# Patient Record
Sex: Female | Born: 1960
Health system: Southern US, Community
[De-identification: ages and names within clinical notes are randomized; demographics above are authoritative.]

## PROBLEM LIST (undated history)

## (undated) DIAGNOSIS — S8290XB Unspecified fracture of unspecified lower leg, initial encounter for open fracture type I or II: Secondary | ICD-10-CM

## (undated) DIAGNOSIS — IMO0002 Reserved for concepts with insufficient information to code with codable children: Secondary | ICD-10-CM

## (undated) HISTORY — DX: Reserved for concepts with insufficient information to code with codable children: IMO0002

## (undated) HISTORY — DX: Unspecified fracture of unspecified lower leg, initial encounter for open fracture type I or II: S82.90XB

## (undated) HISTORY — PX: TONSILLECTOMY AND ADENOIDECTOMY: SUR1326

---

## 1991-06-03 DIAGNOSIS — IMO0002 Reserved for concepts with insufficient information to code with codable children: Secondary | ICD-10-CM

## 1991-06-03 DIAGNOSIS — R87619 Unspecified abnormal cytological findings in specimens from cervix uteri: Secondary | ICD-10-CM

## 1991-06-03 HISTORY — PX: TUBAL LIGATION: SHX77

## 1991-06-03 HISTORY — DX: Unspecified abnormal cytological findings in specimens from cervix uteri: R87.619

## 1991-06-03 HISTORY — DX: Reserved for concepts with insufficient information to code with codable children: IMO0002

## 2000-06-23 ENCOUNTER — Encounter: Admission: RE | Admit: 2000-06-23 | Discharge: 2000-06-23 | Payer: Self-pay | Admitting: Family Medicine

## 2000-06-23 ENCOUNTER — Encounter: Payer: Self-pay | Admitting: Family Medicine

## 2001-10-22 ENCOUNTER — Other Ambulatory Visit: Admission: RE | Admit: 2001-10-22 | Discharge: 2001-10-22 | Payer: Self-pay | Admitting: Obstetrics and Gynecology

## 2001-11-02 ENCOUNTER — Encounter: Admission: RE | Admit: 2001-11-02 | Discharge: 2001-11-02 | Payer: Self-pay | Admitting: *Deleted

## 2001-11-02 ENCOUNTER — Encounter: Payer: Self-pay | Admitting: *Deleted

## 2004-12-24 ENCOUNTER — Other Ambulatory Visit: Admission: RE | Admit: 2004-12-24 | Discharge: 2004-12-24 | Payer: Self-pay | Admitting: Obstetrics and Gynecology

## 2013-03-09 ENCOUNTER — Encounter: Payer: Self-pay | Admitting: Nurse Practitioner

## 2013-03-09 ENCOUNTER — Ambulatory Visit (INDEPENDENT_AMBULATORY_CARE_PROVIDER_SITE_OTHER): Payer: 59 | Admitting: Nurse Practitioner

## 2013-03-09 VITALS — BP 120/74 | HR 64 | Resp 24 | Ht 64.5 in | Wt 125.0 lb

## 2013-03-09 DIAGNOSIS — Z1211 Encounter for screening for malignant neoplasm of colon: Secondary | ICD-10-CM

## 2013-03-09 DIAGNOSIS — J209 Acute bronchitis, unspecified: Secondary | ICD-10-CM

## 2013-03-09 DIAGNOSIS — Z0000 Encounter for general adult medical examination without abnormal findings: Secondary | ICD-10-CM

## 2013-03-09 DIAGNOSIS — Z01419 Encounter for gynecological examination (general) (routine) without abnormal findings: Secondary | ICD-10-CM

## 2013-03-09 DIAGNOSIS — Z23 Encounter for immunization: Secondary | ICD-10-CM

## 2013-03-09 DIAGNOSIS — N912 Amenorrhea, unspecified: Secondary | ICD-10-CM

## 2013-03-09 LAB — TSH: TSH: 2.766 u[IU]/mL (ref 0.350–4.500)

## 2013-03-09 LAB — POCT URINALYSIS DIPSTICK
Bilirubin, UA: NEGATIVE
Blood, UA: NEGATIVE
Glucose, UA: NEGATIVE
Ketones, UA: NEGATIVE
Leukocytes, UA: NEGATIVE
Nitrite, UA: NEGATIVE
Protein, UA: NEGATIVE
Urobilinogen, UA: NEGATIVE
pH, UA: 6

## 2013-03-09 LAB — HEMOGLOBIN, FINGERSTICK: Hemoglobin, fingerstick: 14.4 g/dL (ref 12.0–16.0)

## 2013-03-09 LAB — LIPID PANEL
Cholesterol: 194 mg/dL (ref 0–200)
HDL: 61 mg/dL (ref >39)
LDL Cholesterol: 115 mg/dL — ABNORMAL HIGH (ref 0–99)
Total CHOL/HDL Ratio: 3.2 Ratio
Triglycerides: 89 mg/dL (ref <150)
VLDL: 18 mg/dL (ref 0–40)

## 2013-03-09 LAB — COMPREHENSIVE METABOLIC PANEL
ALT: 10 U/L (ref 0–35)
AST: 15 U/L (ref 0–37)
Albumin: 4.2 g/dL (ref 3.5–5.2)
Alkaline Phosphatase: 68 U/L (ref 39–117)
BUN: 4 mg/dL — ABNORMAL LOW (ref 6–23)
CO2: 25 mEq/L (ref 19–32)
Calcium: 9.5 mg/dL (ref 8.4–10.5)
Chloride: 104 mEq/L (ref 96–112)
Creat: 0.76 mg/dL (ref 0.50–1.10)
Glucose, Bld: 101 mg/dL — ABNORMAL HIGH (ref 70–99)
Potassium: 4.2 mEq/L (ref 3.5–5.3)
Sodium: 140 mEq/L (ref 135–145)
Total Bilirubin: 0.5 mg/dL (ref 0.3–1.2)
Total Protein: 6.7 g/dL (ref 6.0–8.3)

## 2013-03-09 MED ORDER — AZITHROMYCIN 250 MG PO TABS: 250.0000 mg | ORAL_TABLET | ORAL | Status: DC

## 2013-03-09 NOTE — Progress Notes (Signed)
Patient ID: Jo Bell, female   DOB: 1960/12/18, 52 y.o.   MRN: [id number] 52 y.o. G5P3. Married Caucasian Fe here for annual exam.  she has been a former patient of our and she ran a Orthoptist.  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  Now comes in after starting a new job to re establish care.  States during the illness and death of her father in law, she would  skip then stopped having cycles.  Has rare vaso symptoms but has noted vaginal dryness.  She had UTI a few months ago that is resolved.   Now for 1-2 weeks has URI symptoms of nasal congestion and cough that is worse at night.  Denies fever and chills.  No dyspnea and wheezing. She took some left over antibiotics of her husbands once daily for a week without help.    Patient's last menstrual period was 06/05/2011.          Sexually active: yes  The current method of family planning is post menopausal status.    Exercising: yes  Home exercise routine includes walking. Smoker:  yes  Health Maintenance: Pap:  Years ago MMG:  2012, normal Colonoscopy:  Never  BMD:   never TDaP:  >10 years Labs: HB: 14.4 Urine: Negative, pH 6.0    reports that she has been smoking Cigarettes.  She has been smoking about 0.50 packs per day. She has never used smokeless tobacco. She reports that she drinks about 1.5 ounces of alcohol per week. She reports that she does not use illicit drugs.  History reviewed. No pertinent past medical history.  Past Surgical History  Procedure Laterality Date  . Tubal ligation      No current outpatient prescriptions on file.   No current facility-administered medications for this visit.    Family History  Problem Relation Age of Onset  . Breast cancer Mother     ROS:  Pertinent items are noted in HPI.  Otherwise, a comprehensive ROS was negative.  Exam:   BP 120/74  Pulse 64  Resp 24  Ht 5' 4.5" (1.638 m)  Wt 125 lb (56.7 kg)  BMI 21.13 kg/m2  LMP 06/05/2011  Height: 5' 4.5" (163.8 cm)  Ht Readings from Last 3 Encounters:  03/09/13 5' 4.5" (1.638 m)    General appearance: alert, cooperative and appears stated age Head: Normocephalic, without obvious abnormality, atraumatic Neck: no adenopathy, supple, symmetrical, trachea midline and thyroid normal to inspection and palpation Lungs: clear to auscultation bilaterally with a congested cough. Breasts: normal appearance, no masses or tenderness dense with FCB changes. Heart: regular rate and rhythm Abdomen: soft, non-tender; no masses,  no organomegaly Extremities: extremities normal, atraumatic, no cyanosis or edema Skin: Skin color, texture, turgor normal. No rashes or lesions Lymph nodes: Cervical, supraclavicular, and axillary nodes normal. No abnormal inguinal nodes palpated Neurologic: Grossly normal   Pelvic: External genitalia:  no lesions              Urethra:  normal appearing urethra with no masses, tenderness or lesions              Bartholin's and Skene's: normal                 Vagina: normal appearing vagina with normal color and discharge, no lesions              Cervix: anteverted  Pap taken: yes Bimanual Exam:  Uterus:  normal size, contour, position, consistency, mobility, non-tender              Adnexa: no mass, fullness, tenderness               Rectovaginal: Confirms               Anus:  normal sphincter tone, no lesions  A:  Well Woman with normal exam  Menopausal with amenorrhea  Acute bronchitis  [facility] of breast cancer with mother at age 85  Immunization update  P:   Pap smear as per guidelines  pap is done  RX Zithromax Z pack # 6 , if not improved or worsens to see PCP.  Mammogram information to get scheduled  Will get colonoscopy scheduled  Update TDaP today  Follow with labs  Counseled on breast self exam, adequate intake of calcium and vitamin D, diet and exercise, Kegel's exercises return annually or prn  An After Visit Summary was printed  and given to the patient.

## 2013-03-09 NOTE — Patient Instructions (Addendum)

## 2013-03-10 LAB — VITAMIN D 25 HYDROXY (VIT D DEFICIENCY, FRACTURES): Vit D, 25-Hydroxy: 43 ng/mL (ref 30–89)

## 2013-03-10 LAB — FOLLICLE STIMULATING HORMONE: FSH: 81.6 m[IU]/mL

## 2013-03-11 LAB — IPS PAP TEST WITH HPV

## 2013-03-13 NOTE — Progress Notes (Signed)
Encounter reviewed by Dr. Brook Silva.  

## 2013-03-16 ENCOUNTER — Telehealth: Payer: Self-pay | Admitting: *Deleted

## 2013-03-16 NOTE — Telephone Encounter (Signed)
Message copied by Luisa Dago on Wed Mar 16, 2013  9:47 AM ------      Message from: Roanna Banning      Created: Mon Mar 14, 2013  8:13 AM       Let patient know her lab test results.  She was non fasting so OK that glucose is 101. The LDL was slightly elevated.  TSH and Vit D is normal. FSH shows menopausal range.  Would not expect any other vaginal bleed but if that occurs to call back. Pap is normal 02. ------

## 2013-03-16 NOTE — Telephone Encounter (Signed)
I have attempted to contact this patient by phone with the following results: left message to return my call on answering machine (mobile).  

## 2013-03-16 NOTE — Telephone Encounter (Signed)
Pt.notified

## 2013-06-22 ENCOUNTER — Other Ambulatory Visit: Payer: Self-pay | Admitting: Nurse Practitioner

## 2013-06-22 DIAGNOSIS — N63 Unspecified lump in unspecified breast: Secondary | ICD-10-CM

## 2013-07-12 ENCOUNTER — Other Ambulatory Visit: Payer: Self-pay | Admitting: Nurse Practitioner

## 2013-07-12 ENCOUNTER — Ambulatory Visit
Admission: RE | Admit: 2013-07-12 | Discharge: 2013-07-12 | Disposition: A | Discharge status: Home / Self Care | Payer: 59 | Source: Ambulatory Visit | Attending: Nurse Practitioner | Admitting: Nurse Practitioner

## 2013-07-12 DIAGNOSIS — N63 Unspecified lump in unspecified breast: Secondary | ICD-10-CM

## 2013-07-26 ENCOUNTER — Ambulatory Visit
Admission: RE | Admit: 2013-07-26 | Discharge: 2013-07-26 | Disposition: A | Discharge status: Home / Self Care | Payer: 59 | Source: Ambulatory Visit | Attending: Nurse Practitioner | Admitting: Nurse Practitioner

## 2013-07-26 DIAGNOSIS — N63 Unspecified lump in unspecified breast: Secondary | ICD-10-CM

## 2013-07-26 HISTORY — PX: BREAST BIOPSY: SHX20

## 2014-03-14 ENCOUNTER — Encounter: Payer: Self-pay | Admitting: Nurse Practitioner

## 2014-03-14 ENCOUNTER — Ambulatory Visit (INDEPENDENT_AMBULATORY_CARE_PROVIDER_SITE_OTHER): Payer: 59 | Admitting: Nurse Practitioner

## 2014-03-14 VITALS — BP 118/76 | HR 72 | Ht 64.0 in | Wt 131.0 lb

## 2014-03-14 DIAGNOSIS — Z0000 Encounter for general adult medical examination without abnormal findings: Secondary | ICD-10-CM

## 2014-03-14 DIAGNOSIS — N951 Menopausal and female climacteric states: Secondary | ICD-10-CM

## 2014-03-14 DIAGNOSIS — Z01419 Encounter for gynecological examination (general) (routine) without abnormal findings: Secondary | ICD-10-CM

## 2014-03-14 LAB — POCT URINALYSIS DIPSTICK
BILIRUBIN UA: NEGATIVE
Blood, UA: NEGATIVE
GLUCOSE UA: NEGATIVE
KETONES UA: NEGATIVE
LEUKOCYTES UA: NEGATIVE
Nitrite, UA: NEGATIVE
PROTEIN UA: NEGATIVE
Urobilinogen, UA: NEGATIVE
pH, UA: 6

## 2014-03-14 LAB — HEMOGLOBIN, FINGERSTICK: Hemoglobin, fingerstick: 15.6 g/dL (ref 12.0–16.0)

## 2014-03-14 NOTE — Patient Instructions (Signed)

## 2014-03-14 NOTE — Progress Notes (Signed)
Patient ID: Jo Bell, female   DOB: 07/11/1960, 53 y.o.   MRN: [id number] 53 y.o. [id number] Married Caucasian Fe here for annual exam.  Some vaso symptoms at night.  Will start a new job in 2 weeks.  New insurance benefits will be effective after 90 days.  Patient's last menstrual period was 06/05/2011.          Sexually active: yes  The current method of family planning is post menopausal status.  Exercising: yes Home exercise routine includes walking.  Smoker: yes, 1/2 ppd  Health Maintenance:  Pap: 03/09/13, WNL, neg HR HPV  MMG: 07/12/13, left ultrasound, 07/26/13 biopsy; fibrocystic changes, repeat in 1 year  Colonoscopy: Never  BMD: never  TDaP: 03/09/13 Labs:  HB:  15.6  Urine:  Negative     reports that she has been smoking Cigarettes.  She has a 20 pack-year smoking history. She has never used smokeless tobacco. She reports that she drinks about 1.5 - 2 ounces of alcohol per week. She reports that she does not use illicit drugs.  Past Medical History  Diagnosis Date  . Open fracture of leg age 47    bar went through right leg  . Abnormal Pap smear 1993    after childbirth with cryo, normal since    Past Surgical History  Procedure Laterality Date  . Tonsillectomy and adenoidectomy  age 53  . Tubal ligation  1993  . Breast biopsy Left 07/26/13    fibrocystic changes    No current outpatient prescriptions on file.   No current facility-administered medications for this visit.    Family History  Problem Relation Age of Onset  . Breast cancer Mother 1476    no treatment yet  . Diabetes Mother   . Breast cancer Maternal Grandmother     maybe age 45050's    ROS:  Pertinent items are noted in HPI.  Otherwise, a comprehensive ROS was negative.  Exam:   BP 118/76  Pulse 72  Ht 5\' 4"  (1.626 m)  Wt 131 lb (59.421 kg)  BMI 22.47 kg/m2  LMP 06/05/2011 Height: 5\' 4"  (162.6 cm)  Ht Readings from Last 3 Encounters:  03/14/14 5\' 4"  (1.626 m)  03/09/13 5' 4.5" (1.638 m)     General appearance: alert, cooperative and appears stated age Head: Normocephalic, without obvious abnormality, atraumatic Neck: no adenopathy, supple, symmetrical, trachea midline and thyroid normal to inspection and palpation Lungs: clear to auscultation bilaterally Breasts: normal appearance, no masses or tenderness Heart: regular rate and rhythm Abdomen: soft, non-tender; no masses,  no organomegaly Extremities: extremities normal, atraumatic, no cyanosis or edema Skin: Skin color, texture, turgor normal. No rashes or lesions Lymph nodes: Cervical, supraclavicular, and axillary nodes normal. No abnormal inguinal nodes palpated Neurologic: Grossly normal   Pelvic: External genitalia:  no lesions              Urethra:  normal appearing urethra with no masses, tenderness or lesions              Bartholin's and Skene's: normal                 Vagina: normal appearing vagina with normal color and discharge, no lesions              Cervix: anteverted              Pap taken: No. Bimanual Exam:  Uterus:  normal size, contour, position, consistency, mobility, non-tender  Adnexa: no mass, fullness, tenderness               Rectovaginal: Confirms               Anus:  normal sphincter tone, no lesions  A:  Well Woman with normal exam  Menopausal no HRT  [facility] 51   P:   Reviewed health and wellness pertinent to exam  Pap smear not taken today  Mammogram is due 2/16  She will schedule PCP appointment for colonoscopy after new insurance is effective  Counseled on breast self exam, mammography screening, adequate intake of calcium and vitamin D, diet and exercise return annually or prn  An After Visit Summary was printed and given to the patient.

## 2014-03-15 NOTE — Progress Notes (Signed)
Encounter reviewed by Dr. Brook Silva.  

## 2014-04-03 ENCOUNTER — Encounter: Payer: Self-pay | Admitting: Nurse Practitioner

## 2014-07-11 ENCOUNTER — Telehealth: Payer: Self-pay | Admitting: Nurse Practitioner

## 2014-07-11 NOTE — Telephone Encounter (Signed)
LMTCB about canceled appointment with PG °

## 2015-03-23 ENCOUNTER — Ambulatory Visit: Payer: 59 | Admitting: Nurse Practitioner

## 2016-08-20 ENCOUNTER — Ambulatory Visit: Payer: Self-pay | Admitting: Nurse Practitioner

## 2016-08-20 NOTE — Progress Notes (Deleted)
56 y.o. [id number] Married  Caucasian Fe here for annual exam.    Patient's last menstrual period was 06/05/2011.          Sexually active: {yes no:314532}  The current method of family planning is post menopausal status.    Exercising: {yes no:314532}  {types:19826} Smoker:  yes  Health Maintenance: Pap:  03/09/13, WNL, neg HR HPV  MMG:  07/12/13, left ultrasound, 07/26/13 biopsy; fibrocystic changes, repeat in 1 year  Colonoscopy:  *** TDaP:  03/09/13 Hep C and HIV: *** Labs: ***   reports that she has been smoking Cigarettes.  She has a 20.00 pack-year smoking history. She has never used smokeless tobacco. She reports that she drinks about 1.5 - 2.0 oz of alcohol per week . She reports that she does not use drugs.  Past Medical History:  Diagnosis Date  . Abnormal Pap smear 1993   after childbirth with cryo, normal since  . Open fracture of leg age 74   bar went through right leg    Past Surgical History:  Procedure Laterality Date  . BREAST BIOPSY Left 07/26/13   fibrocystic changes  . TONSILLECTOMY AND ADENOIDECTOMY  age [age]  . TUBAL LIGATION  1993    No current outpatient prescriptions on file.   No current facility-administered medications for this visit.     Family History  Problem Relation Age of Onset  . Breast cancer Mother 3876    no treatment yet  . Diabetes Mother   . Breast cancer Maternal Grandmother     maybe age 54's    ROS:  Pertinent items are noted in HPI.  Otherwise, a comprehensive ROS was negative.  Exam:   LMP 06/05/2011    Ht Readings from Last 3 Encounters:  03/14/14 5\' 4"  (1.626 m)  03/09/13 5' 4.5" (1.638 m)    General appearance: alert, cooperative and appears stated age Head: Normocephalic, without obvious abnormality, atraumatic Neck: no adenopathy, supple, symmetrical, trachea midline and thyroid {EXAM; THYROID:18604} Lungs: clear to auscultation bilaterally Breasts: {Exam; breast:13139::"normal appearance, no masses or  tenderness"} Heart: regular rate and rhythm Abdomen: soft, non-tender; no masses,  no organomegaly Extremities: extremities normal, atraumatic, no cyanosis or edema Skin: Skin color, texture, turgor normal. No rashes or lesions Lymph nodes: Cervical, supraclavicular, and axillary nodes normal. No abnormal inguinal nodes palpated Neurologic: Grossly normal   Pelvic: External genitalia:  no lesions              Urethra:  normal appearing urethra with no masses, tenderness or lesions              Bartholin's and Skene's: normal                 Vagina: normal appearing vagina with normal color and discharge, no lesions              Cervix: {exam; cervix:14595}              Pap taken: {yes no:314532} Bimanual Exam:  Uterus:  {exam; uterus:12215}              Adnexa: {exam; adnexa:12223}               Rectovaginal: Confirms               Anus:  normal sphincter tone, no lesions  Chaperone present: ***  A:  Well Woman with normal exam    Menopausal no HRT  [facility] of breast cancer with mother at age 82     P:   Reviewed health and wellness pertinent to exam  Pap smear as above  {plan; gyn:5269::"mammogram","pap smear","return annually or prn"}  An After Visit Summary was printed and given to the patient.

## 2016-09-17 ENCOUNTER — Ambulatory Visit: Payer: Self-pay | Admitting: Nurse Practitioner

## 2017-01-01 ENCOUNTER — Ambulatory Visit (INDEPENDENT_AMBULATORY_CARE_PROVIDER_SITE_OTHER): Payer: BLUE CROSS/BLUE SHIELD | Admitting: Certified Nurse Midwife

## 2017-01-01 ENCOUNTER — Other Ambulatory Visit (HOSPITAL_COMMUNITY)
Admission: RE | Admit: 2017-01-01 | Discharge: 2017-01-01 | Disposition: A | Discharge status: Home / Self Care | Payer: BLUE CROSS/BLUE SHIELD | Source: Ambulatory Visit | Attending: Certified Nurse Midwife | Admitting: Certified Nurse Midwife

## 2017-01-01 ENCOUNTER — Encounter: Payer: Self-pay | Admitting: Certified Nurse Midwife

## 2017-01-01 VITALS — BP 96/60 | HR 64 | Resp 16 | Ht 64.25 in | Wt 134.0 lb

## 2017-01-01 DIAGNOSIS — Z01419 Encounter for gynecological examination (general) (routine) without abnormal findings: Secondary | ICD-10-CM | POA: Diagnosis not present

## 2017-01-01 DIAGNOSIS — Z129 Encounter for screening for malignant neoplasm, site unspecified: Secondary | ICD-10-CM

## 2017-01-01 DIAGNOSIS — Z124 Encounter for screening for malignant neoplasm of cervix: Secondary | ICD-10-CM | POA: Diagnosis not present

## 2017-01-01 DIAGNOSIS — Z0000 Encounter for general adult medical examination without abnormal findings: Secondary | ICD-10-CM | POA: Diagnosis not present

## 2017-01-01 DIAGNOSIS — Z803 Family history of malignant neoplasm of breast: Secondary | ICD-10-CM

## 2017-01-01 DIAGNOSIS — R899 Unspecified abnormal finding in specimens from other organs, systems and tissues: Secondary | ICD-10-CM

## 2017-01-01 NOTE — Patient Instructions (Signed)
EXERCISE AND DIET:  We recommended that you start or continue a regular exercise program for good health. Regular exercise means any activity that makes your heart beat faster and makes you sweat.  We recommend exercising at least 30 minutes per day at least 3 days a week, preferably 4 or 5.  We also recommend a diet low in fat and sugar.  Inactivity, poor dietary choices and obesity can cause diabetes, heart attack, stroke, and kidney damage, among others.    ALCOHOL AND SMOKING:  Women should limit their alcohol intake to no more than 7 drinks/beers/glasses of wine (combined, not each!) per week. Moderation of alcohol intake to this level decreases your risk of breast cancer and liver damage. And of course, no recreational drugs are part of a healthy lifestyle.  And absolutely no smoking or even second hand smoke. Most people know smoking can cause heart and lung diseases, but did you know it also contributes to weakening of your bones? Aging of your skin?  Yellowing of your teeth and nails?  CALCIUM AND VITAMIN D:  Adequate intake of calcium and Vitamin D are recommended.  The recommendations for exact amounts of these supplements seem to change often, but generally speaking 600 mg of calcium (either carbonate or citrate) and 800 units of Vitamin D per day seems prudent. Certain women may benefit from higher intake of Vitamin D.  If you are among these women, your doctor will have told you during your visit.    PAP SMEARS:  Pap smears, to check for cervical cancer or precancers,  have traditionally been done yearly, although recent scientific advances have shown that most women can have pap smears less often.  However, every woman still should have a physical exam from her gynecologist every year. It will include a breast check, inspection of the vulva and vagina to check for abnormal growths or skin changes, a visual exam of the cervix, and then an exam to evaluate the size and shape of the uterus and  ovaries.  And after 54 years of age, a rectal exam is indicated to check for rectal cancers. We will also provide age appropriate advice regarding health maintenance, like when you should have certain vaccines, screening for sexually transmitted diseases, bone density testing, colonoscopy, mammograms, etc.   MAMMOGRAMS:  All women over 40 years old should have a yearly mammogram. Many facilities now offer a "3D" mammogram, which may cost around $50 extra out of pocket. If possible,  we recommend you accept the option to have the 3D mammogram performed.  It both reduces the number of women who will be called back for extra views which then turn out to be normal, and it is better than the routine mammogram at detecting truly abnormal areas.    COLONOSCOPY:  Colonoscopy to screen for colon cancer is recommended for all women at age 50.  We know, you hate the idea of the prep.  We agree, BUT, having colon cancer and not knowing it is worse!!  Colon cancer so often starts as a polyp that can be seen and removed at colonscopy, which can quite literally save your life!  And if your first colonoscopy is normal and you have no family history of colon cancer, most women don't have to have it again for 10 years.  Once every ten years, you can do something that may end up saving your life, right?  We will be happy to help you get it scheduled when you are ready.    Be sure to check your insurance coverage so you understand how much it will cost.  It may be covered as a preventative service at no cost, but you should check your particular policy.      Colonoscopy, Adult A colonoscopy is an exam to look at the entire large intestine. During the exam, a lubricated, bendable tube is inserted into the anus and then passed into the rectum, colon, and other parts of the large intestine. A colonoscopy is often done as a part of normal colorectal screening or in response to certain symptoms, such as anemia, persistent diarrhea,  abdominal pain, and blood in the stool. The exam can help screen for and diagnose medical problems, including:  Tumors.  Polyps.  Inflammation.  Areas of bleeding.  Tell a health care provider about:  Any allergies you have.  All medicines you are taking, including vitamins, herbs, eye drops, creams, and over-the-counter medicines.  Any problems you or family members have had with anesthetic medicines.  Any blood disorders you have.  Any surgeries you have had.  Any medical conditions you have.  Any problems you have had passing stool. What are the risks? Generally, this is a safe procedure. However, problems may occur, including:  Bleeding.  A tear in the intestine.  A reaction to medicines given during the exam.  Infection (rare).  What happens before the procedure? Eating and drinking restrictions Follow instructions from your health care provider about eating and drinking, which may include:  A few days before the procedure - follow a low-fiber diet. Avoid nuts, seeds, dried fruit, raw fruits, and vegetables.  1-3 days before the procedure - follow a clear liquid diet. Drink only clear liquids, such as clear broth or bouillon, black coffee or tea, clear juice, clear soft drinks or sports drinks, gelatin dessert, and popsicles. Avoid any liquids that contain red or purple dye.  On the day of the procedure - do not eat or drink anything during the 2 hours before the procedure, or within the time period that your health care provider recommends.  Bowel prep If you were prescribed an oral bowel prep to clean out your colon:  Take it as told by your health care provider. Starting the day before your procedure, you will need to drink a large amount of medicated liquid. The liquid will cause you to have multiple loose stools until your stool is almost clear or light green.  If your skin or anus gets irritated from diarrhea, you may use these to relieve the  irritation: ? Medicated wipes, such as adult wet wipes with aloe and vitamin E. ? A skin soothing-product like petroleum jelly.  If you vomit while drinking the bowel prep, take a break for up to 60 minutes and then begin the bowel prep again. If vomiting continues and you cannot take the bowel prep without vomiting, call your health care provider.  General instructions  Ask your health care provider about changing or stopping your regular medicines. This is especially important if you are taking diabetes medicines or blood thinners.  Plan to have someone take you home from the hospital or clinic. What happens during the procedure?  An IV tube may be inserted into one of your veins.  You will be given medicine to help you relax (sedative).  To reduce your risk of infection: ? Your health care team will wash or sanitize their hands. ? Your anal area will be washed with soap.  You will be asked to  lie on your side with your knees bent.  Your health care provider will lubricate a long, thin, flexible tube. The tube will have a camera and a light on the end.  The tube will be inserted into your anus.  The tube will be gently eased through your rectum and colon.  Air will be delivered into your colon to keep it open. You may feel some pressure or cramping.  The camera will be used to take images during the procedure.  A small tissue sample may be removed from your body to be examined under a microscope (biopsy). If any potential problems are found, the tissue will be sent to a lab for testing.  If small polyps are found, your health care provider may remove them and have them checked for cancer cells.  The tube that was inserted into your anus will be slowly removed. The procedure may vary among health care providers and hospitals. What happens after the procedure?  Your blood pressure, heart rate, breathing rate, and blood oxygen level will be monitored until the medicines you  were given have worn off.  Do not drive for 24 hours after the exam.  You may have a small amount of blood in your stool.  You may pass gas and have mild abdominal cramping or bloating due to the air that was used to inflate your colon during the exam.  It is up to you to get the results of your procedure. Ask your health care provider, or the department performing the procedure, when your results will be ready. This information is not intended to replace advice given to you by your health care provider. Make sure you discuss any questions you have with your health care provider. Document Released: 05/16/2000 Document Revised: 03/19/2016 Document Reviewed: 07/31/2015 [employer] Interactive Patient Education  2018 [employer]

## 2017-01-01 NOTE — Progress Notes (Signed)
56 y.o. [id number] Married  Caucasian Fe here for annual exam. Menopausal no HRT. Denies vaginal bleeding or vaginal dryness. Patient very busy with 2 jobs, and now one may have lay off. Very fatigued and would like screening labs. Patient is working on diet with spouse who has health issues. Has not had mammogram or colonoscopy due to job changes. Would like to schedule now. No other health issues today. Screening labs if needed.  Patient's last menstrual period was 06/05/2011.          Sexually active: No.  The current method of family planning is post menopausal status.    Exercising: Yes.    walking, swim Smoker:  yes  Health Maintenance: Pap:  03-09-13 neg HPV HR neg History of Abnormal Pap: yes, cryo in the past MMG:  2015 biopsy (fibrocystic changes with calcifications in the left breast) Self Breast exams: yes Colonoscopy:  none BMD:   none TDaP:  2014 Shingles: no Pneumonia: no Hep C and HIV: HIV neg yrs ago Labs: yes   reports that she has been smoking Cigarettes.  She has a 10.00 pack-year smoking history. She has never used smokeless tobacco. She reports that she drinks about 0.6 - 3.0 oz of alcohol per week . She reports that she does not use drugs.  Past Medical History:  Diagnosis Date  . Abnormal Pap smear 1993   after childbirth with cryo, normal since  . Open fracture of leg age 21   bar went through right leg    Past Surgical History:  Procedure Laterality Date  . BREAST BIOPSY Left 07/26/13   fibrocystic changes  . TONSILLECTOMY AND ADENOIDECTOMY  age 56  . TUBAL LIGATION  1993    No current outpatient prescriptions on file.   No current facility-administered medications for this visit.     Family History  Problem Relation Age of Onset  . Breast cancer Mother [id number]       no treatment yet  . Diabetes Mother   . Breast cancer Maternal Grandmother        maybe age 54's    ROS:  Pertinent items are noted in HPI.  Otherwise, a comprehensive ROS was  negative.  Exam:   BP 96/60   Pulse 64   Resp 16   Ht 5' 4.25" (1.632 m)   Wt 134 lb (60.8 kg)   LMP 06/05/2011   BMI 22.82 kg/m  Height: 5' 4.25" (163.2 cm) Ht Readings from Last 3 Encounters:  01/01/17 5' 4.25" (1.632 m)  03/14/14 5\' 4"  (1.626 m)  03/09/13 5' 4.5" (1.638 m)    General appearance: alert, cooperative and appears stated age Head: Normocephalic, without obvious abnormality, atraumatic Neck: no adenopathy, supple, symmetrical, trachea midline and thyroid normal to inspection and palpation Lungs: clear to auscultation bilaterally Breasts: normal appearance, no masses or tenderness, No nipple retraction or dimpling, No nipple discharge or bleeding, No axillary or supraclavicular adenopathy Heart: regular rate and rhythm Abdomen: soft, non-tender; no masses,  no organomegaly Extremities: extremities normal, atraumatic, no cyanosis or edema Skin: Skin color, texture, turgor normal. No rashes or lesions Lymph nodes: Cervical, supraclavicular, and axillary nodes normal. No abnormal inguinal nodes palpated Neurologic: Grossly normal   Pelvic: External genitalia:  no lesions              Urethra:  normal appearing urethra with no masses, tenderness or lesions              Bartholin's and Skene's: normal  Vagina: normal appearing vagina with normal color and discharge, no lesions              Cervix: no bleeding following Pap, no cervical motion tenderness and no lesions              Pap taken: Yes.   Bimanual Exam:  Uterus:  normal size, contour, position, consistency, mobility, non-tender              Adnexa: normal adnexa and no mass, fullness, tenderness               Rectovaginal: Confirms               Anus:  normal sphincter tone, no lesions  Chaperone present: yes  A:  Well Woman with normal exam  Menopausal no HRT  Mammogram overdue  Colonoscopy due  Family history of breast cancer, MGM 50,Mother 6276  P:   Reviewed health and wellness  pertinent to exam  Aware of need to evaluate if vaginal bleeding  Patient requests scheduling will be done prior to leaving today  Referral order placed, patient will be called with information regarding appointment  Stressed importance of breast SBE and mammogram. Also discussed genetic screening. Patient declines, mother has not received treatment yet, but will advise to have genetic screening  Pap smear: yes   counseled on breast self exam, mammography screening, adequate intake of calcium and vitamin D, diet and exercise  return annually or prn  An After Visit Summary was printed and given to the patient.

## 2017-01-02 LAB — COMPREHENSIVE METABOLIC PANEL
A/G RATIO: 1.8 (ref 1.2–2.2)
ALK PHOS: 84 IU/L (ref 39–117)
ALT: 12 IU/L (ref 0–32)
AST: 14 IU/L (ref 0–40)
Albumin: 4.7 g/dL (ref 3.5–5.5)
BILIRUBIN TOTAL: 0.5 mg/dL (ref 0.0–1.2)
BUN / CREAT RATIO: 9 (ref 9–23)
BUN: 8 mg/dL (ref 6–24)
CHLORIDE: 104 mmol/L (ref 96–106)
CO2: 18 mmol/L — ABNORMAL LOW (ref 20–29)
Calcium: 9.7 mg/dL (ref 8.7–10.2)
Creatinine, Ser: 0.85 mg/dL (ref 0.57–1.00)
GFR calc non Af Amer: 78 mL/min/{1.73_m2} (ref >59)
GFR, EST AFRICAN AMERICAN: 90 mL/min/{1.73_m2} (ref >59)
GLOBULIN, TOTAL: 2.6 g/dL (ref 1.5–4.5)
Glucose: 80 mg/dL (ref 65–99)
Potassium: 4.6 mmol/L (ref 3.5–5.2)
SODIUM: 141 mmol/L (ref 134–144)
Total Protein: 7.3 g/dL (ref 6.0–8.5)

## 2017-01-02 LAB — CBC
Hematocrit: 42.4 % (ref 34.0–46.6)
Hemoglobin: 14.7 g/dL (ref 11.1–15.9)
MCH: 32.9 pg (ref 26.6–33.0)
MCHC: 34.7 g/dL (ref 31.5–35.7)
MCV: 95 fL (ref 79–97)
PLATELETS: 233 10*3/uL (ref 150–379)
RBC: 4.47 x10E6/uL (ref 3.77–5.28)
RDW: 13.1 % (ref 12.3–15.4)
WBC: 8.8 10*3/uL (ref 3.4–10.8)

## 2017-01-02 LAB — CYTOLOGY - PAP
DIAGNOSIS: NEGATIVE
HPV: NOT DETECTED

## 2017-01-02 LAB — TSH: TSH: 3.04 u[IU]/mL (ref 0.450–4.500)

## 2017-01-02 LAB — LIPID PANEL
Chol/HDL Ratio: 3.9 ratio (ref 0.0–4.4)
Cholesterol, Total: 244 mg/dL — ABNORMAL HIGH (ref 100–199)
HDL: 62 mg/dL (ref >39)
LDL CALC: 164 mg/dL — AB (ref 0–99)
TRIGLYCERIDES: 88 mg/dL (ref 0–149)
VLDL Cholesterol Cal: 18 mg/dL (ref 5–40)

## 2017-01-02 LAB — VITAMIN D 25 HYDROXY (VIT D DEFICIENCY, FRACTURES): Vit D, 25-Hydroxy: 47.5 ng/mL (ref 30.0–100.0)

## 2017-01-05 NOTE — Addendum Note (Signed)
Addended by: Zenovia JordanMITCHELL, TAYLOR A on: 01/05/2017 09:38 AM   Modules accepted: Orders

## 2017-01-19 ENCOUNTER — Telehealth: Payer: Self-pay | Admitting: Certified Nurse Midwife

## 2017-01-19 NOTE — Telephone Encounter (Signed)
Left voicemail regarding referral appointment. The information is listed below. Should the patient need to cancel or reschedule this appointment, please advise them to call the office they've been referred to in order to reschedule.  [facility] [address] [place]  Phone: [phone]  Dr Loreta Ave 02-03-17 @ 9:30am. Please arrive 15 minutes early and bring your insurance card, photo id and list of medications.

## 2017-02-03 ENCOUNTER — Encounter: Payer: Self-pay | Admitting: Obstetrics and Gynecology

## 2017-02-03 ENCOUNTER — Ambulatory Visit (INDEPENDENT_AMBULATORY_CARE_PROVIDER_SITE_OTHER): Payer: Self-pay | Admitting: Obstetrics and Gynecology

## 2017-02-03 ENCOUNTER — Telehealth: Payer: Self-pay | Admitting: Certified Nurse Midwife

## 2017-02-03 VITALS — BP 110/60 | HR 64 | Resp 16 | Wt 137.0 lb

## 2017-02-03 DIAGNOSIS — R3129 Other microscopic hematuria: Secondary | ICD-10-CM

## 2017-02-03 DIAGNOSIS — N3090 Cystitis, unspecified without hematuria: Secondary | ICD-10-CM

## 2017-02-03 LAB — POCT URINALYSIS DIPSTICK
Bilirubin, UA: NEGATIVE
Glucose, UA: NEGATIVE
Ketones, UA: NEGATIVE
NITRITE UA: NEGATIVE
Protein, UA: NEGATIVE
UROBILINOGEN UA: NEGATIVE U/dL — AB
pH, UA: 6.5 (ref 5.0–8.0)

## 2017-02-03 MED ORDER — SULFAMETHOXAZOLE-TRIMETHOPRIM 800-160 MG PO TABS: 1.0000 | ORAL_TABLET | Freq: Two times a day (BID) | ORAL | 0 refills | Status: DC

## 2017-02-03 MED ORDER — PHENAZOPYRIDINE HCL 200 MG PO TABS: 200.0000 mg | ORAL_TABLET | Freq: Three times a day (TID) | ORAL | 0 refills | Status: DC | PRN

## 2017-02-03 NOTE — Patient Instructions (Signed)

## 2017-02-03 NOTE — Progress Notes (Signed)
GYNECOLOGY  VISIT   HPI: 56 y.o.   Married  Caucasian  female   [phone] with Patient's last menstrual period was 06/05/2011.   here c/o being unable to void. She is also having some spotting and pelvic x 2 days. For the last 2 days she is having trouble voiding, feels she need to void, very little to anything is coming out. She is having baseline discomfort in her lower abdomen. Hurts in her lower abdomen and vagina when she tries to void. This morning she started voiding normally, then mid way felt like an ice pick was going up her vagina and the urine stopped coming. When she wiped there was blood. She has noticed some spotting when she wipes after trying to void over the last 2 days. No blood on her underwear. She had some urge incontinence last night, small amount. Urine is not grossly bloody. No fevers, no flank pain.      She is a smoker, 1/2 PPD, trying to quit.  GYNECOLOGIC HISTORY: Patient's last menstrual period was 06/05/2011. Contraception:postmenopause  Menopausal hormone therapy: none         OB History    Gravida Para Term Preterm AB Living   5 3 3   2 3    SAB TAB Ectopic Multiple Live Births                     There are no active problems to display for this patient.   Past Medical History:  Diagnosis Date  . Abnormal Pap smear 1993   after childbirth with cryo, normal since  . Open fracture of leg age 79   bar went through right leg    Past Surgical History:  Procedure Laterality Date  . BREAST BIOPSY Left 07/26/13   fibrocystic changes  . TONSILLECTOMY AND ADENOIDECTOMY  age 17  . TUBAL LIGATION  1993    No current outpatient prescriptions on file.   No current facility-administered medications for this visit.      ALLERGIES: Novocain [procaine]  Family History  Problem Relation Age of Onset  . Breast cancer Mother 48       no treatment yet  . Diabetes Mother   . Breast cancer Maternal Grandmother        maybe age 44's    Social History    Social History  . Marital status: Married    Spouse name: N/A  . Number of children: N/A  . Years of education: N/A   Occupational History  . Not on file.   Social History Main Topics  . Smoking status: Current Every Day Smoker    Packs/day: 0.50    Years: 20.00    Types: Cigarettes  . Smokeless tobacco: Never Used  . Alcohol use 0.6 - 3.0 oz/week    1 - 5 Standard drinks or equivalent per week  . Drug use: No  . Sexual activity: No   Other Topics Concern  . Not on file   Social History Narrative  . No narrative on file    Review of Systems  Constitutional: Negative.   HENT: Negative.   Eyes: Negative.   Respiratory: Negative.   Cardiovascular: Negative.   Gastrointestinal: Negative.   Genitourinary: Positive for urgency.       Unable to void Pelvic pain  Vaginal spotting   Musculoskeletal: Negative.   Skin: Negative.   Neurological: Negative.   Endo/Heme/Allergies: Negative.   Psychiatric/Behavioral: Negative.     PHYSICAL EXAMINATION:  BP 110/60 (BP Location: Right Arm, Patient Position: Sitting, Cuff Size: Normal)   Pulse 64   Resp 16   Wt 137 lb (62.1 kg)   LMP 06/05/2011   BMI 23.33 kg/m     General appearance: alert, cooperative and appears stated age Abdomen: soft, mildly tender mid lower abdomen. No rebound, no guarding, minimal distended; no masses,  no organomegaly  Pelvic: External genitalia:  no lesions              Urethra:  normal appearing urethra with no masses, tenderness or lesions              Bartholins and Skenes: normal                 Vagina: normal appearing vagina with normal color and discharge, no lesions              Cervix: no lesions              Bimanual Exam:  Uterus:  uterus is small, anteverted, mildly tender              Adnexa: no mass, fullness, tenderness              Bladder is very tender to palpation  St cath ua: 100 cc (she hasn't voided for a few hours). Foley was placed, then removed  Chaperone was  present for exam.  ASSESSMENT Difficulty voiding, blood in her urine, suspect UTI No blood seen vaginally    PLAN Bactrim DS Pyridium Send urine for ua, c&s She will call if she develops fevers, flank pain, or isn't improving in 24-48 hours.   An After Visit Summary was printed and given to the patient.

## 2017-02-03 NOTE — Telephone Encounter (Signed)
Patient said "I am having a hard time urinating and have started spotting". Patient is asking for an appointment today if possible.

## 2017-02-03 NOTE — Telephone Encounter (Signed)
Spoke with patient. Patient states that on Sunday 02/01/2017 she began having discomfort with urination. Yesterday began having trouble urinating. Able to initiate  stream, but then could not empty bladder. Began to have light spotting. Reports being very uncomfortable. Used the restroom this morning and began having sharp pain. Could not empty bladder completely. Drinking lots of water and cranberry juice. Is still having light spotting. Denies heavy bleeding, lower back pain, fever, or chills. Appointment scheduled for today 02/03/2017 at 10 am with Dr.Jertson. Patient is agreeable.  Routing to provider for final review. Patient agreeable to disposition. Will close encounter.

## 2017-02-04 LAB — URINALYSIS, MICROSCOPIC ONLY
BACTERIA UA: NONE SEEN
CASTS: NONE SEEN /LPF
EPITHELIAL CELLS (NON RENAL): NONE SEEN /HPF (ref 0–10)

## 2017-02-05 LAB — URINE CULTURE

## 2017-04-03 ENCOUNTER — Other Ambulatory Visit: Payer: Self-pay

## 2017-05-12 ENCOUNTER — Telehealth: Payer: Self-pay

## 2017-05-12 NOTE — Telephone Encounter (Signed)
Called patient to see about scheduling follow up labwork for cmp & lipids. Pt states she is unable to schedule right now due to her job. She is suppose to be going permanent with them in the next 3-4 weeks & will call to schedule it then. Pt aware of importance of follow up. Routing to D Leonard,cmn for review.

## 2017-09-10 ENCOUNTER — Other Ambulatory Visit: Payer: Self-pay

## 2017-09-14 ENCOUNTER — Other Ambulatory Visit (INDEPENDENT_AMBULATORY_CARE_PROVIDER_SITE_OTHER): Payer: PRIVATE HEALTH INSURANCE

## 2017-09-14 ENCOUNTER — Telehealth: Payer: Self-pay

## 2017-09-14 DIAGNOSIS — R899 Unspecified abnormal finding in specimens from other organs, systems and tissues: Secondary | ICD-10-CM

## 2017-09-14 DIAGNOSIS — E7800 Pure hypercholesterolemia, unspecified: Secondary | ICD-10-CM

## 2017-09-14 DIAGNOSIS — Z0000 Encounter for general adult medical examination without abnormal findings: Secondary | ICD-10-CM

## 2017-09-15 LAB — COMPREHENSIVE METABOLIC PANEL
ALBUMIN: 4.7 g/dL (ref 3.5–5.5)
ALK PHOS: 75 IU/L (ref 39–117)
ALT: 11 IU/L (ref 0–32)
AST: 14 IU/L (ref 0–40)
Albumin/Globulin Ratio: 2 (ref 1.2–2.2)
BUN/Creatinine Ratio: 12 (ref 9–23)
BUN: 9 mg/dL (ref 6–24)
Bilirubin Total: 0.5 mg/dL (ref 0.0–1.2)
CALCIUM: 9.5 mg/dL (ref 8.7–10.2)
CO2: 25 mmol/L (ref 20–29)
CREATININE: 0.76 mg/dL (ref 0.57–1.00)
Chloride: 100 mmol/L (ref 96–106)
GFR calc non Af Amer: 89 mL/min/{1.73_m2} (ref >59)
GFR, EST AFRICAN AMERICAN: 103 mL/min/{1.73_m2} (ref >59)
GLOBULIN, TOTAL: 2.4 g/dL (ref 1.5–4.5)
Glucose: 77 mg/dL (ref 65–99)
Potassium: 4.3 mmol/L (ref 3.5–5.2)
SODIUM: 142 mmol/L (ref 134–144)
Total Protein: 7.1 g/dL (ref 6.0–8.5)

## 2017-09-15 LAB — LIPID PANEL
CHOL/HDL RATIO: 3.3 ratio (ref 0.0–4.4)
Cholesterol, Total: 223 mg/dL — ABNORMAL HIGH (ref 100–199)
HDL: 68 mg/dL (ref >39)
LDL CALC: 132 mg/dL — AB (ref 0–99)
Triglycerides: 116 mg/dL (ref 0–149)
VLDL Cholesterol Cal: 23 mg/dL (ref 5–40)

## 2017-10-05 ENCOUNTER — Telehealth: Payer: Self-pay | Admitting: *Deleted

## 2017-10-05 NOTE — Telephone Encounter (Signed)
Referral from Dr. Vassie Moment at [facility], [phone], sent to scheduling.

## 2017-10-15 NOTE — Telephone Encounter (Signed)
Erroneous encounter

## 2019-04-13 ENCOUNTER — Other Ambulatory Visit: Payer: Self-pay | Admitting: Obstetrics & Gynecology

## 2019-04-13 DIAGNOSIS — Z1231 Encounter for screening mammogram for malignant neoplasm of breast: Secondary | ICD-10-CM

## 2019-04-25 NOTE — Progress Notes (Signed)
58 y.o. [id number] Married  Caucasian Fe here for annual exam. Menopausal no vaginal bleeding, vaginal dryness still a problem, has used coconut oil with some success, but happy with choice. Has not established with PCP at this point, would like information. Screening labs today. No other health issues.  Patient's last menstrual period was 06/05/2011.          Sexually active: No.  The current method of family planning is post menopausal status.    Exercising: No.  exercise Smoker:  yes  Review of Systems  Constitutional:       Low energy, no sex drive  HENT: Negative.   Eyes: Negative.   Respiratory: Negative.   Cardiovascular: Negative.   Gastrointestinal: Negative.   Genitourinary: Negative.   Musculoskeletal: Negative.   Skin: Negative.   Neurological: Negative.   Endo/Heme/Allergies: Negative.   Psychiatric/Behavioral: Negative.     Health Maintenance: Pap:  03-09-13 neg HPV HR neg, 01-01-17 neg HPV HR neg History of Abnormal Pap: yes, cryo in the past MMG:  2015 biopsy (fibrocystic changes with calcifications in left breast), scheduled for 06/2019 Self Breast exams: yes Colonoscopy:  2018 or 2019  BMD:   none TDaP:  2014 Shingles: not done Pneumonia: not done Hep C and HIV: HIV neg yrs ago Labs: yes   reports that she has been smoking cigarettes. She has a 10.00 pack-year smoking history. She has never used smokeless tobacco. She reports current alcohol use of about 1.0 - 5.0 standard drinks of alcohol per week. She reports that she does not use drugs.  Past Medical History:  Diagnosis Date  . Abnormal Pap smear 1993   after childbirth with cryo, normal since  . Open fracture of leg age 19   bar went through right leg    Past Surgical History:  Procedure Laterality Date  . BREAST BIOPSY Left 07/26/13   fibrocystic changes  . TONSILLECTOMY AND ADENOIDECTOMY  age 18  . TUBAL LIGATION  1993    Current Outpatient Medications  Medication Sig Dispense Refill  . Omega-3  Fatty Acids (OMEGA-3 FISH OIL PO) Take by mouth.    . phenazopyridine (PYRIDIUM) 200 MG tablet Take 1 tablet (200 mg total) by mouth 3 (three) times daily as needed. 6 tablet 0  . sulfamethoxazole-trimethoprim (BACTRIM DS) 800-160 MG tablet Take 1 tablet by mouth 2 (two) times daily. One PO BID x 3 days 6 tablet 0   No current facility-administered medications for this visit.     Family History  Problem Relation Age of Onset  . Breast cancer Mother 71       no treatment yet  . Diabetes Mother   . Breast cancer Maternal Grandmother        maybe age 83's    ROS:  Pertinent items are noted in HPI.  Otherwise, a comprehensive ROS was negative.  Exam:   LMP 06/05/2011    Ht Readings from Last 3 Encounters:  01/01/17 5' 4.25" (1.632 m)  03/14/14 5\' 4"  (1.626 m)  03/09/13 5' 4.5" (1.638 m)    General appearance: alert, cooperative and appears stated age Head: Normocephalic, without obvious abnormality, atraumatic Neck: no adenopathy, supple, symmetrical, trachea midline and thyroid normal to inspection and palpation Lungs: clear to auscultation bilaterally Breasts: normal appearance, no masses or tenderness, No nipple retraction or dimpling, No nipple discharge or bleeding, No axillary or supraclavicular adenopathy Heart: regular rate and rhythm Abdomen: soft, non-tender; no masses,  no organomegaly Extremities: extremities normal, atraumatic, no cyanosis  or edema Skin: Skin color, texture, turgor normal. No rashes or lesions Lymph nodes: Cervical, supraclavicular, and axillary nodes normal. No abnormal inguinal nodes palpated Neurologic: Grossly normal   Pelvic: External genitalia:  no lesions              Urethra:  normal appearing urethra with no masses, tenderness or lesions              Bartholin's and Skene's: normal                 Vagina: normal appearing vagina with normal color and discharge, no lesions              Cervix: no cervical motion tenderness, no lesions and  normal appearance              Pap taken: No. Bimanual Exam:  Uterus:  normal size, contour, position, consistency, mobility, non-tender and anteverted              Adnexa: normal adnexa and no mass, fullness, tenderness               Rectovaginal: Confirms               Anus:  normal sphincter tone, no lesions  Chaperone present: yes  A:  Well Woman with normal exam  Menopausal no HRT  Decreased Libido and spouse also  Fatigue ? Vitamin D deficiency  Vaginal dryness using coconut oil  Screening labs  P:   Reviewed health and wellness pertinent to exam  Aware of need to advise if vaginal bleeding  Discussed taking time to be together. Given [employer] handout, that might be helpful.   Discussed trying Replens OTC to see if this works better for her.  Lab: Vitamin D, TSH, Lipid panel, Hep C, CMP, CBC  Pap smear: no   counseled on breast self exam, mammography screening, feminine hygiene, menopause, adequate intake of calcium and vitamin D, diet and exercise  return annually or prn  An After Visit Summary was printed and given to the patient.

## 2019-04-26 ENCOUNTER — Ambulatory Visit: Payer: BC Managed Care – PPO | Admitting: Certified Nurse Midwife

## 2019-04-26 ENCOUNTER — Encounter: Payer: Self-pay | Admitting: Certified Nurse Midwife

## 2019-04-26 ENCOUNTER — Other Ambulatory Visit: Payer: Self-pay

## 2019-04-26 VITALS — BP 104/60 | HR 64 | Temp 97.0°F | Resp 16 | Ht 64.5 in | Wt 147.0 lb

## 2019-04-26 DIAGNOSIS — Z0000 Encounter for general adult medical examination without abnormal findings: Secondary | ICD-10-CM | POA: Diagnosis not present

## 2019-04-26 DIAGNOSIS — N951 Menopausal and female climacteric states: Secondary | ICD-10-CM

## 2019-04-26 DIAGNOSIS — Z01419 Encounter for gynecological examination (general) (routine) without abnormal findings: Secondary | ICD-10-CM

## 2019-04-26 DIAGNOSIS — R5383 Other fatigue: Secondary | ICD-10-CM

## 2019-04-26 DIAGNOSIS — E559 Vitamin D deficiency, unspecified: Secondary | ICD-10-CM

## 2019-04-26 DIAGNOSIS — R6882 Decreased libido: Secondary | ICD-10-CM

## 2019-04-26 NOTE — Patient Instructions (Addendum)
EXERCISE AND DIET:  We recommended that you start or continue a regular exercise program for good health. Regular exercise means any activity that makes your heart beat faster and makes you sweat.  We recommend exercising at least 30 minutes per day at least 3 days a week, preferably 4 or 5.  We also recommend a diet low in fat and sugar.  Inactivity, poor dietary choices and obesity can cause diabetes, heart attack, stroke, and kidney damage, among others.    ALCOHOL AND SMOKING:  Women should limit their alcohol intake to no more than 7 drinks/beers/glasses of wine (combined, not each!) per week. Moderation of alcohol intake to this level decreases your risk of breast cancer and liver damage. And of course, no recreational drugs are part of a healthy lifestyle.  And absolutely no smoking or even second hand smoke. Most people know smoking can cause heart and lung diseases, but did you know it also contributes to weakening of your bones? Aging of your skin?  Yellowing of your teeth and nails?  CALCIUM AND VITAMIN D:  Adequate intake of calcium and Vitamin D are recommended.  The recommendations for exact amounts of these supplements seem to change often, but generally speaking 600 mg of calcium (either carbonate or citrate) and 800 units of Vitamin D per day seems prudent. Certain women may benefit from higher intake of Vitamin D.  If you are among these women, your doctor will have told you during your visit.    PAP SMEARS:  Pap smears, to check for cervical cancer or precancers,  have traditionally been done yearly, although recent scientific advances have shown that most women can have pap smears less often.  However, every woman still should have a physical exam from her gynecologist every year. It will include a breast check, inspection of the vulva and vagina to check for abnormal growths or skin changes, a visual exam of the cervix, and then an exam to evaluate the size and shape of the uterus and  ovaries.  And after 56 years of age, a rectal exam is indicated to check for rectal cancers. We will also provide age appropriate advice regarding health maintenance, like when you should have certain vaccines, screening for sexually transmitted diseases, bone density testing, colonoscopy, mammograms, etc.   MAMMOGRAMS:  All women over 40 years old should have a yearly mammogram. Many facilities now offer a "3D" mammogram, which may cost around $50 extra out of pocket. If possible,  we recommend you accept the option to have the 3D mammogram performed.  It both reduces the number of women who will be called back for extra views which then turn out to be normal, and it is better than the routine mammogram at detecting truly abnormal areas.    COLONOSCOPY:  Colonoscopy to screen for colon cancer is recommended for all women at age 50.  We know, you hate the idea of the prep.  We agree, BUT, having colon cancer and not knowing it is worse!!  Colon cancer so often starts as a polyp that can be seen and removed at colonscopy, which can quite literally save your life!  And if your first colonoscopy is normal and you have no family history of colon cancer, most women don't have to have it again for 10 years.  Once every ten years, you can do something that may end up saving your life, right?  We will be happy to help you get it scheduled when you are ready.    Be sure to check your insurance coverage so you understand how much it will cost.  It may be covered as a preventative service at no cost, but you should check your particular policy.      Menopause Menopause is the normal time of life when menstrual periods stop completely. It is usually confirmed by 12 months without a menstrual period. The transition to menopause (perimenopause) most often happens between the ages of 45 and 55. During perimenopause, hormone levels change in your body, which can cause symptoms and affect your health. Menopause may increase  your risk for:  Loss of bone (osteoporosis), which causes bone breaks (fractures).  Depression.  Hardening and narrowing of the arteries (atherosclerosis), which can cause heart attacks and strokes. What are the causes? This condition is usually caused by a natural change in hormone levels that happens as you get older. The condition may also be caused by surgery to remove both ovaries (bilateral oophorectomy). What increases the risk? This condition is more likely to start at an earlier age if you have certain medical conditions or treatments, including:  A tumor of the pituitary gland in the brain.  A disease that affects the ovaries and hormone production.  Radiation treatment for cancer.  Certain cancer treatments, such as chemotherapy or hormone (anti-estrogen) therapy.  Heavy smoking and excessive alcohol use.  Family history of early menopause. This condition is also more likely to develop earlier in women who are very thin. What are the signs or symptoms? Symptoms of this condition include:  Hot flashes.  Irregular menstrual periods.  Night sweats.  Changes in feelings about sex. This could be a decrease in sex drive or an increased comfort around your sexuality.  Vaginal dryness and thinning of the vaginal walls. This may cause painful intercourse.  Dryness of the skin and development of wrinkles.  Headaches.  Problems sleeping (insomnia).  Mood swings or irritability.  Memory problems.  Weight gain.  Hair growth on the face and chest.  Bladder infections or problems with urinating. How is this diagnosed? This condition is diagnosed based on your medical history, a physical exam, your age, your menstrual history, and your symptoms. Hormone tests may also be done. How is this treated? In some cases, no treatment is needed. You and your health care provider should make a decision together about whether treatment is necessary. Treatment will be based on  your individual condition and preferences. Treatment for this condition focuses on managing symptoms. Treatment may include:  Menopausal hormone therapy (MHT).  Medicines to treat specific symptoms or complications.  Acupuncture.  Vitamin or herbal supplements. Before starting treatment, make sure to let your health care provider know if you have a personal or family history of:  Heart disease.  Breast cancer.  Blood clots.  Diabetes.  Osteoporosis. Follow these instructions at home: Lifestyle  Do not use any products that contain nicotine or tobacco, such as cigarettes and e-cigarettes. If you need help quitting, ask your health care provider.  Get at least 30 minutes of physical activity on 5 or more days each week.  Avoid alcoholic and caffeinated beverages, as well as spicy foods. This may help prevent hot flashes.  Get 7-8 hours of sleep each night.  If you have hot flashes, try: ? Dressing in layers. ? Avoiding things that may trigger hot flashes, such as spicy food, warm places, or stress. ? Taking slow, deep breaths when a hot flash starts. ? Keeping a fan in your home and   office.  Find ways to manage stress, such as deep breathing, meditation, or journaling.  Consider going to group therapy with other women who are having menopause symptoms. Ask your health care provider about recommended group therapy meetings. Eating and drinking  Eat a healthy, balanced diet that contains whole grains, lean protein, low-fat dairy, and plenty of fruits and vegetables.  Your health care provider may recommend adding more soy to your diet. Foods that contain soy include tofu, tempeh, and soy milk.  Eat plenty of foods that contain calcium and vitamin D for bone health. Items that are rich in calcium include low-fat milk, yogurt, beans, almonds, sardines, broccoli, and kale. Medicines  Take over-the-counter and prescription medicines only as told by your health care provider.   Talk with your health care provider before starting any herbal supplements. If prescribed, take vitamins and supplements as told by your health care provider. These may include: ? Calcium. Women age 51 and older should get 1,200 mg (milligrams) of calcium every day. ? Vitamin D. Women need 600-800 International Units of vitamin D each day. ? Vitamins B12 and B6. Aim for 50 micrograms of B12 and 1.5 mg of B6 each day. General instructions  Keep track of your menstrual periods, including: ? When they occur. ? How heavy they are and how long they last. ? How much time passes between periods.  Keep track of your symptoms, noting when they start, how often you have them, and how long they last.  Use vaginal lubricants or moisturizers to help with vaginal dryness and improve comfort during sex.  Keep all follow-up visits as told by your health care provider. This is important. This includes any group therapy or counseling. Contact a health care provider if:  You are still having menstrual periods after age 55.  You have pain during sex.  You have not had a period for 12 months and you develop vaginal bleeding. Get help right away if:  You have: ? Severe depression. ? Excessive vaginal bleeding. ? Pain when you urinate. ? A fast or irregular heart beat (palpitations). ? Severe headaches. ? Abdomen (abdominal) pain or severe indigestion.  You fell and you think you have a broken bone.  You develop leg or chest pain.  You develop vision problems.  You feel a lump in your breast. Summary  Menopause is the normal time of life when menstrual periods stop completely. It is usually confirmed by 12 months without a menstrual period.  The transition to menopause (perimenopause) most often happens between the ages of 45 and 55.  Symptoms can be managed through medicines, lifestyle changes, and complementary therapies such as acupuncture.  Eat a balanced diet that is rich in  nutrients to promote bone health and heart health and to manage symptoms during menopause. This information is not intended to replace advice given to you by your health care provider. Make sure you discuss any questions you have with your health care provider. Document Released: 08/09/2003 Document Revised: 05/01/2017 Document Reviewed: 06/21/2016 Elsevier Patient Education  2020 [employer] Inc.  

## 2019-04-27 LAB — COMPREHENSIVE METABOLIC PANEL
ALT: 14 IU/L (ref 0–32)
AST: 20 IU/L (ref 0–40)
Albumin/Globulin Ratio: 2.3 — ABNORMAL HIGH (ref 1.2–2.2)
Albumin: 4.8 g/dL (ref 3.8–4.9)
Alkaline Phosphatase: 88 IU/L (ref 39–117)
BUN/Creatinine Ratio: 8 — ABNORMAL LOW (ref 9–23)
BUN: 7 mg/dL (ref 6–24)
Bilirubin Total: 0.4 mg/dL (ref 0.0–1.2)
CO2: 23 mmol/L (ref 20–29)
Calcium: 9.5 mg/dL (ref 8.7–10.2)
Chloride: 104 mmol/L (ref 96–106)
Creatinine, Ser: 0.87 mg/dL (ref 0.57–1.00)
GFR calc Af Amer: 86 mL/min/{1.73_m2} (ref >59)
GFR calc non Af Amer: 75 mL/min/{1.73_m2} (ref >59)
Globulin, Total: 2.1 g/dL (ref 1.5–4.5)
Glucose: 86 mg/dL (ref 65–99)
Potassium: 4.6 mmol/L (ref 3.5–5.2)
Sodium: 141 mmol/L (ref 134–144)
Total Protein: 6.9 g/dL (ref 6.0–8.5)

## 2019-04-27 LAB — LIPID PANEL
Chol/HDL Ratio: 3.5 ratio (ref 0.0–4.4)
Cholesterol, Total: 266 mg/dL — ABNORMAL HIGH (ref 100–199)
HDL: 76 mg/dL (ref >39)
LDL Chol Calc (NIH): 178 mg/dL — ABNORMAL HIGH (ref 0–99)
Triglycerides: 74 mg/dL (ref 0–149)
VLDL Cholesterol Cal: 12 mg/dL (ref 5–40)

## 2019-04-27 LAB — VITAMIN D 25 HYDROXY (VIT D DEFICIENCY, FRACTURES): Vit D, 25-Hydroxy: 21 ng/mL — ABNORMAL LOW (ref 30.0–100.0)

## 2019-04-27 LAB — TSH: TSH: 4.64 u[IU]/mL — ABNORMAL HIGH (ref 0.450–4.500)

## 2019-04-27 LAB — CBC
Hematocrit: 43.5 % (ref 34.0–46.6)
Hemoglobin: 14.7 g/dL (ref 11.1–15.9)
MCH: 32.2 pg (ref 26.6–33.0)
MCHC: 33.8 g/dL (ref 31.5–35.7)
MCV: 95 fL (ref 79–97)
Platelets: 251 10*3/uL (ref 150–450)
RBC: 4.57 x10E6/uL (ref 3.77–5.28)
RDW: 12.6 % (ref 11.7–15.4)
WBC: 8.4 10*3/uL (ref 3.4–10.8)

## 2019-04-27 LAB — HEPATITIS C ANTIBODY: Hep C Virus Ab: <0.1 s/co ratio (ref 0.0–0.9)

## 2019-05-01 ENCOUNTER — Other Ambulatory Visit: Payer: Self-pay | Admitting: Certified Nurse Midwife

## 2019-05-01 DIAGNOSIS — R849 Unspecified abnormal finding in specimens from respiratory organs and thorax: Secondary | ICD-10-CM

## 2019-05-02 ENCOUNTER — Telehealth: Payer: Self-pay

## 2019-05-02 NOTE — Telephone Encounter (Signed)
Left message to call back  

## 2019-05-02 NOTE — Telephone Encounter (Signed)
-----   Message from Regina Eck, CNM sent at 05/01/2019  1:11 PM EST ----- Notify patient her TSH is borderline elevated, needs recheck in 4 weeks, order in Vitamin D is low needs to start on Vitamin D 3 2000 IU daily and recheck in 3 months order in CBC is normal no anemia Lipid panel shows cholesterol elevation at 266 normal is <200 Triglycerides were 74 normal HDL is normal at 76 LDL is elevated at 178, She needs to follow up with PCP regarding the elevation. I think she has appointment in 05/2019. She will need copy of labs Liver, kidney and glucose profile essentially  Normal Hep C was negative

## 2019-05-02 NOTE — Telephone Encounter (Signed)
Patient returning call to Joy.  

## 2019-05-02 NOTE — Telephone Encounter (Signed)
Patient notified of results as written by provider 

## 2019-05-02 NOTE — Telephone Encounter (Signed)
Left message for call back.

## 2019-06-06 ENCOUNTER — Ambulatory Visit: Payer: BLUE CROSS/BLUE SHIELD

## 2019-06-13 ENCOUNTER — Other Ambulatory Visit: Payer: BC Managed Care – PPO

## 2019-07-15 ENCOUNTER — Ambulatory Visit: Payer: BC Managed Care – PPO

## 2019-08-02 ENCOUNTER — Other Ambulatory Visit: Payer: BC Managed Care – PPO

## 2019-08-22 ENCOUNTER — Encounter: Payer: Self-pay | Admitting: Certified Nurse Midwife

## 2019-09-26 ENCOUNTER — Ambulatory Visit
Admission: RE | Admit: 2019-09-26 | Discharge: 2019-09-26 | Disposition: A | Discharge status: Home / Self Care | Payer: BC Managed Care – PPO | Source: Ambulatory Visit | Attending: Obstetrics & Gynecology | Admitting: Obstetrics & Gynecology

## 2019-09-26 ENCOUNTER — Other Ambulatory Visit: Payer: Self-pay

## 2019-09-26 DIAGNOSIS — Z1231 Encounter for screening mammogram for malignant neoplasm of breast: Secondary | ICD-10-CM

## 2020-04-23 NOTE — Progress Notes (Signed)
59 y.o. [id number] Married White or Caucasian female here for annual exam.    Last year had exhaustion and had blood panel with abnormal lipid panel and slightly abnormal TSH. Seen by Deboraha Sprang last year and recommended cardiology appointment but did not end up going.   Pt working full time at [employer]  Does not want pap screening every 5 years, prefers every 3 years. Will collect today  Patient's last menstrual period was 06/05/2011.          Sexually active: Yes.    The current method of family planning is post menopausal status.    Exercising: Yes.    some Smoker:  yes  Health Maintenance: Pap:  01-01-17 neg HPV HR neg History of abnormal Pap:  Yes, cryo in the past MMG:  09-26-2019 category c density birads 1:neg Colonoscopy:  2018 or 2019 polyps f/u 81yrs BMD:   none TDaP:  2014 Gardasil:   n/a Covid-19: not done Pneumonia vaccine(s):  Not done Shingrix:   Not done Hep C testing: neg 2020 Screening Labs: wants to be done here   reports that she has been smoking cigarettes. She has smoked for the past 20.00 years. She has never used smokeless tobacco. She reports current alcohol use. She reports that she does not use drugs.  Past Medical History:  Diagnosis Date  . Abnormal Pap smear 1993   after childbirth with cryo, normal since  . Open fracture of leg age 69   bar went through right leg    Past Surgical History:  Procedure Laterality Date  . BREAST BIOPSY Left 07/26/13   fibrocystic changes  . TONSILLECTOMY AND ADENOIDECTOMY  age 41  . TUBAL LIGATION  1993    Current Outpatient Medications  Medication Sig Dispense Refill  . Cholecalciferol (VITAMIN D3 PO) Take by mouth.    . Omega-3 Fatty Acids (OMEGA-3 FISH OIL PO) Take by mouth.     No current facility-administered medications for this visit.    Family History  Problem Relation Age of Onset  . Breast cancer Mother 82       no treatment yet  . Diabetes Mother   . Breast cancer Maternal Grandmother        maybe age  15's    Review of Systems  Constitutional:       Exhaustion, loss of sex drive  HENT: Negative.   Eyes: Negative.   Respiratory: Negative.   Cardiovascular: Negative.   Gastrointestinal: Negative.   Endocrine: Negative.   Genitourinary: Negative.   Musculoskeletal:       Leg cramps  Skin: Negative.   Allergic/Immunologic: Negative.   Neurological: Negative.   Hematological: Negative.   Psychiatric/Behavioral: Negative.     Exam:   BP 110/74   Pulse 68   Resp 16   Ht 5' 4.25" (1.632 m)   Wt 145 lb (65.8 kg)   LMP 06/05/2011   BMI 24.70 kg/m   Height: 5' 4.25" (163.2 cm)  General appearance: alert, cooperative and appears stated age Head: Normocephalic, without obvious abnormality, atraumatic Neck: no adenopathy, supple, symmetrical, trachea midline and thyroid normal to inspection and palpation Lungs: clear to auscultation bilaterally Breasts: normal appearance, no masses or tenderness, Normal to palpation without dominant masses Heart: regular rate and rhythm Abdomen: soft, non-tender; bowel sounds normal; no masses,  no organomegaly Extremities: extremities normal, atraumatic, no cyanosis or edema Skin: Skin color, texture, turgor normal. No rashes or lesions Lymph nodes: Cervical, supraclavicular, and axillary nodes normal. No abnormal inguinal  nodes palpated Neurologic: Grossly normal   Pelvic: External genitalia:  no lesions              Urethra:  normal appearing urethra with no masses, tenderness or lesions              Bartholins and Skenes: normal                 Vagina: normal appearing vagina with normal color and discharge, no lesions              Cervix: no cervical motion tenderness and no lesions              Pap taken: Yes.   Bimanual Exam:  Uterus:  normal size, contour, position, consistency, mobility, non-tender              Adnexa: no mass, fullness, tenderness               Rectovaginal: Confirms               Anus:  normal sphincter tone, no  lesions  Shannon, CMA Chaperone was present for exam.  A:  Well Woman with normal exam  Tobacco use  Previous abnormal TSH, previous Vit D insfficency  P:   Mammogram 09/2019  pap smear done today  Discussed smoking cessation  Labs: TSH, Vit D, Chemistry, Lipid  Will recommend Dexa before 60 r/t smoking risk, will discuss next year  Advised strongly recommend returning to PCP and establishing care  return annually or prn

## 2020-04-30 ENCOUNTER — Other Ambulatory Visit (HOSPITAL_COMMUNITY)
Admission: RE | Admit: 2020-04-30 | Discharge: 2020-04-30 | Disposition: A | Discharge status: Home / Self Care | Payer: BC Managed Care – PPO | Source: Ambulatory Visit | Attending: Obstetrics and Gynecology | Admitting: Obstetrics and Gynecology

## 2020-04-30 ENCOUNTER — Ambulatory Visit: Payer: BC Managed Care – PPO | Admitting: Nurse Practitioner

## 2020-04-30 ENCOUNTER — Other Ambulatory Visit: Payer: Self-pay

## 2020-04-30 ENCOUNTER — Ambulatory Visit: Payer: BC Managed Care – PPO | Admitting: Certified Nurse Midwife

## 2020-04-30 ENCOUNTER — Encounter: Payer: Self-pay | Admitting: Nurse Practitioner

## 2020-04-30 VITALS — BP 110/74 | HR 68 | Resp 16 | Ht 64.25 in | Wt 145.0 lb

## 2020-04-30 DIAGNOSIS — Z01419 Encounter for gynecological examination (general) (routine) without abnormal findings: Secondary | ICD-10-CM

## 2020-04-30 DIAGNOSIS — R7989 Other specified abnormal findings of blood chemistry: Secondary | ICD-10-CM | POA: Diagnosis not present

## 2020-04-30 DIAGNOSIS — Z720 Tobacco use: Secondary | ICD-10-CM

## 2020-04-30 NOTE — Patient Instructions (Signed)
Coping with Quitting Smoking  Quitting smoking is a physical and mental challenge. You will face cravings, withdrawal symptoms, and temptation. Before quitting, work with your health care provider to make a plan that can help you cope. Preparation can help you quit and keep you from giving in. How can I cope with cravings? Cravings usually last for 5-10 minutes. If you get through it, the craving will pass. Consider taking the following actions to help you cope with cravings:  Keep your mouth busy: ? Chew sugar-free gum. ? Suck on hard candies or a straw. ? Brush your teeth.  Keep your hands and body busy: ? Immediately change to a different activity when you feel a craving. ? Squeeze or play with a ball. ? Do an activity or a hobby, like making bead jewelry, practicing needlepoint, or working with wood. ? Mix up your normal routine. ? Take a short exercise break. Go for a quick walk or run up and down stairs. ? Spend time in public places where smoking is not allowed.  Focus on doing something kind or helpful for someone else.  Call a friend or family member to talk during a craving.  Join a support group.  Call a quit line, such as 1-800-QUIT-NOW.  Talk with your health care provider about medicines that might help you cope with cravings and make quitting easier for you. How can I deal with withdrawal symptoms? Your body may experience negative effects as it tries to get used to not having nicotine in the system. These effects are called withdrawal symptoms. They may include:  Feeling hungrier than normal.  Trouble concentrating.  Irritability.  Trouble sleeping.  Feeling depressed.  Restlessness and agitation.  Craving a cigarette. To manage withdrawal symptoms:  Avoid places, people, and activities that trigger your cravings.  Remember why you want to quit.  Get plenty of sleep.  Avoid coffee and other caffeinated drinks. These may worsen some of your  symptoms. How can I handle social situations? Social situations can be difficult when you are quitting smoking, especially in the first few weeks. To manage this, you can:  Avoid parties, bars, and other social situations where people might be smoking.  Avoid alcohol.  Leave right away if you have the urge to smoke.  Explain to your family and friends that you are quitting smoking. Ask for understanding and support.  Plan activities with friends or family where smoking is not an option. What are some ways I can cope with stress? Wanting to smoke may cause stress, and stress can make you want to smoke. Find ways to manage your stress. Relaxation techniques can help. For example:  Breathe slowly and deeply, in through your nose and out through your mouth.  Listen to soothing, relaxing music.  Talk with a family member or friend about your stress.  Light a candle.  Soak in a bath or take a shower.  Think about a peaceful place. What are some ways I can prevent weight gain? Be aware that many people gain weight after they quit smoking. However, not everyone does. To keep from gaining weight, have a plan in place before you quit and stick to the plan after you quit. Your plan should include:  Having healthy snacks. When you have a craving, it may help to: ? Eat plain popcorn, crunchy carrots, celery, or other cut vegetables. ? Chew sugar-free gum.  Changing how you eat: ? Eat small portion sizes at meals. ? Eat 4-6 small meals   throughout the day instead of 1-2 large meals a day. ? Be mindful when you eat. Do not watch television or do other things that might distract you as you eat.  Exercising regularly: ? Make time to exercise each day. If you do not have time for a long workout, do short bouts of exercise for 5-10 minutes several times a day. ? Do some form of strengthening exercise, like weight lifting, and some form of aerobic exercise, like running or swimming.  Drinking  plenty of water or other low-calorie or no-calorie drinks. Drink 6-8 glasses of water daily, or as much as instructed by your health care provider. Summary  Quitting smoking is a physical and mental challenge. You will face cravings, withdrawal symptoms, and temptation to smoke again. Preparation can help you as you go through these challenges.  You can cope with cravings by keeping your mouth busy (such as by chewing gum), keeping your body and hands busy, and making calls to family, friends, or a helpline for people who want to quit smoking.  You can cope with withdrawal symptoms by avoiding places where people smoke, avoiding drinks with caffeine, and getting plenty of rest.  Ask your health care provider about the different ways to prevent weight gain, avoid stress, and handle social situations. This information is not intended to replace advice given to you by your health care provider. Make sure you discuss any questions you have with your health care provider. Document Revised: 05/01/2017 Document Reviewed: 05/16/2016 Elsevier Patient Education  2020 Else[employer] Inc.  

## 2020-05-01 LAB — COMPREHENSIVE METABOLIC PANEL
ALT: 12 IU/L (ref 0–32)
AST: 15 IU/L (ref 0–40)
Albumin/Globulin Ratio: 1.9 (ref 1.2–2.2)
Albumin: 4.4 g/dL (ref 3.8–4.9)
Alkaline Phosphatase: 78 IU/L (ref 44–121)
BUN/Creatinine Ratio: 13 (ref 9–23)
BUN: 10 mg/dL (ref 6–24)
Bilirubin Total: 0.3 mg/dL (ref 0.0–1.2)
CO2: 22 mmol/L (ref 20–29)
Calcium: 9.3 mg/dL (ref 8.7–10.2)
Chloride: 105 mmol/L (ref 96–106)
Creatinine, Ser: 0.76 mg/dL (ref 0.57–1.00)
GFR calc Af Amer: 101 mL/min/{1.73_m2} (ref >59)
GFR calc non Af Amer: 87 mL/min/{1.73_m2} (ref >59)
Globulin, Total: 2.3 g/dL (ref 1.5–4.5)
Glucose: 97 mg/dL (ref 65–99)
Potassium: 4.5 mmol/L (ref 3.5–5.2)
Sodium: 139 mmol/L (ref 134–144)
Total Protein: 6.7 g/dL (ref 6.0–8.5)

## 2020-05-01 LAB — LIPID PANEL WITH LDL/HDL RATIO
Cholesterol, Total: 254 mg/dL — ABNORMAL HIGH (ref 100–199)
HDL: 70 mg/dL (ref >39)
LDL Chol Calc (NIH): 169 mg/dL — ABNORMAL HIGH (ref 0–99)
LDL/HDL Ratio: 2.4 ratio (ref 0.0–3.2)
Triglycerides: 86 mg/dL (ref 0–149)
VLDL Cholesterol Cal: 15 mg/dL (ref 5–40)

## 2020-05-01 LAB — THYROID PANEL WITH TSH
Free Thyroxine Index: 1.8 (ref 1.2–4.9)
T3 Uptake Ratio: 26 % (ref 24–39)
T4, Total: 7 ug/dL (ref 4.5–12.0)
TSH: 3.87 u[IU]/mL (ref 0.450–4.500)

## 2020-05-01 LAB — VITAMIN D 25 HYDROXY (VIT D DEFICIENCY, FRACTURES): Vit D, 25-Hydroxy: 57.2 ng/mL (ref 30.0–100.0)

## 2020-05-02 LAB — CYTOLOGY - PAP: Diagnosis: NEGATIVE

## 2020-05-08 ENCOUNTER — Telehealth: Payer: Self-pay

## 2020-05-08 NOTE — Telephone Encounter (Signed)
Message not needed. °

## 2020-10-17 ENCOUNTER — Telehealth: Payer: Self-pay | Admitting: *Deleted

## 2020-10-17 NOTE — Telephone Encounter (Signed)
Patient called and left message in triage voicemail asking for her blood type. I called and left message patient to call me.

## 2020-12-09 ENCOUNTER — Ambulatory Visit (INDEPENDENT_AMBULATORY_CARE_PROVIDER_SITE_OTHER): Payer: BC Managed Care – PPO

## 2020-12-09 ENCOUNTER — Ambulatory Visit
Admission: EM | Admit: 2020-12-09 | Discharge: 2020-12-09 | Disposition: A | Discharge status: Home / Self Care | Payer: BC Managed Care – PPO | Attending: Student | Admitting: Student

## 2020-12-09 ENCOUNTER — Other Ambulatory Visit: Payer: Self-pay

## 2020-12-09 DIAGNOSIS — S2232XA Fracture of one rib, left side, initial encounter for closed fracture: Secondary | ICD-10-CM | POA: Diagnosis not present

## 2020-12-09 DIAGNOSIS — S2242XA Multiple fractures of ribs, left side, initial encounter for closed fracture: Secondary | ICD-10-CM | POA: Diagnosis not present

## 2020-12-09 DIAGNOSIS — W19XXXA Unspecified fall, initial encounter: Secondary | ICD-10-CM

## 2020-12-09 DIAGNOSIS — R0782 Intercostal pain: Secondary | ICD-10-CM

## 2020-12-09 MED ORDER — TIZANIDINE HCL 2 MG PO TABS: 2.0000 mg | ORAL_TABLET | Freq: Four times a day (QID) | ORAL | 0 refills | Status: DC | PRN

## 2020-12-09 MED ORDER — TRAMADOL HCL 50 MG PO TABS: 50.0000 mg | ORAL_TABLET | Freq: Four times a day (QID) | ORAL | 0 refills | Status: DC | PRN

## 2020-12-09 NOTE — Discharge Instructions (Addendum)
-  You unfortunately have a minimally displaced fracture of the lateral left sixth rib. No abnormality of the lungs or internal organs was seen on the xrays.  -For pain- -Start the muscle relaxer-Zanaflex (tizanidine), up to 3 times daily for muscle spasms and pain.  This can make you drowsy, so take at bedtime or when you do not need to drive or operate machinery. -Also, tramadol up to every 6 hours for pain. This mediation can also cause drowsiness. Try to avoid taking Tramadol and Zanaflex at the same time until you know how they affect your body.  -You can still take tylenol and ibuprofen for additional relief. Take Tylenol 1000 mg 3 times daily, and ibuprofen 800 mg 3 times daily with food.  You can take these together, or alternate every 3-4 hours. -Sitting, standing and walking is okay- but avoid things that cause significant pain.  -Ace wrap if you desire support -Follow-up with your primary care in about 2 weeks for recheck, particularly in things aren't improving. You can alternatively follow-up with [place].

## 2020-12-09 NOTE — ED Provider Notes (Signed)
[facility]    CSN: [id number] Arrival date & time: 12/09/20  1150      History   Chief Complaint Chief Complaint  Patient presents with   Back Pain    Left thoracic    HPI Jo Bell is a 60 y.o. female presenting with L side pain x5 days following fall.  Medical history noncontributory.  States that she was at a campsite this weekend, she tripped over a chair and then fell on her left side on a tree branch.  Since then she has had significant pain on the left side.  It is bearable at rest, but if she moves or takes a deep breath then it hurts.  Denies shortness of breath, abdominal pain, change in bowel or bladder function, hematuria, constipation, etc.  Denies hitting her head, loss of consciousness, headaches, dizziness, vision changes.  HPI  Past Medical History:  Diagnosis Date   Abnormal Pap smear 1993   after childbirth with cryo, normal since   Open fracture of leg age 44   bar went through right leg    There are no problems to display for this patient.   Past Surgical History:  Procedure Laterality Date   BREAST BIOPSY Left 07/26/13   fibrocystic changes   TONSILLECTOMY AND ADENOIDECTOMY  age 33   TUBAL LIGATION  1993    OB History     Gravida  5   Para  3   Term  3   Preterm      AB  2   Living  3      SAB      IAB      Ectopic      Multiple      Live Births               Home Medications    Prior to Admission medications   Medication Sig Start Date End Date Taking? Authorizing Provider  tiZANidine (ZANAFLEX) 2 MG tablet Take 1 tablet (2 mg total) by mouth every 6 (six) hours as needed for muscle spasms. 12/09/20  Yes Rhys Martini, PA-C  traMADol (ULTRAM) 50 MG tablet Take 1 tablet (50 mg total) by mouth every 6 (six) hours as needed. 12/09/20  Yes Rhys Martini, PA-C  Cholecalciferol (VITAMIN D3 PO) Take by mouth.    [provider]  Omega-3 Fatty Acids (OMEGA-3 FISH OIL PO) Take by mouth.     [provider]    Family History Family History  Problem Relation Age of Onset   Breast cancer Mother 23       no treatment yet   Diabetes Mother    Breast cancer Maternal Grandmother        maybe age 82's    Social History Social History   Tobacco Use   Smoking status: Every Day    Years: 20.00    Pack years: 0.00    Types: Cigarettes   Smokeless tobacco: Never  Substance Use Topics   Alcohol use: Yes    Alcohol/week: 0.0 - 5.0 standard drinks   Drug use: No     Allergies   Novocain [procaine] and Penicillins   Review of Systems Review of Systems  Musculoskeletal:        L side pain  All other systems reviewed and are negative.   Physical Exam Triage Vital Signs ED Triage Vitals  Enc Vitals Group     BP 12/09/20 1258 (!) 152/82  Pulse Rate 12/09/20 1258 68     Resp 12/09/20 1258 18     Temp 12/09/20 1258 97.7 F (36.5 C)     Temp Source 12/09/20 1258 Oral     SpO2 12/09/20 1258 95 %     Weight --      Height --      Head Circumference --      Peak Flow --      Pain Score 12/09/20 1303 5     Pain Loc --      Pain Edu? --      Excl. in GC? --    No data found.  Updated Vital Signs BP (!) 152/82 (BP Location: Left Arm)   Pulse 68   Temp 97.7 F (36.5 C) (Oral)   Resp 18   LMP 06/05/2011   SpO2 95%   Visual Acuity Right Eye Distance:   Left Eye Distance:   Bilateral Distance:    Right Eye Near:   Left Eye Near:    Bilateral Near:     Physical Exam Vitals reviewed.  Constitutional:      General: She is not in acute distress.    Appearance: Normal appearance. She is not ill-appearing.  HENT:     Head: Normocephalic and atraumatic.     Mouth/Throat:     Mouth: Mucous membranes are moist.     Comments: Moist mucous membranes Eyes:     Extraocular Movements: Extraocular movements intact.     Pupils: Pupils are equal, round, and reactive to light.  Cardiovascular:     Rate and Rhythm: Normal rate and regular rhythm.      Heart sounds: Normal heart sounds.  Pulmonary:     Effort: Pulmonary effort is normal.     Breath sounds: Normal breath sounds. No wheezing, rhonchi or rales.     Comments: BS full throughout Abdominal:     General: Bowel sounds are normal. There is no distension.     Palpations: Abdomen is soft. There is no mass.     Tenderness: There is no abdominal tenderness. There is no right CVA tenderness, left CVA tenderness, guarding or rebound.  Musculoskeletal:     Comments: L lateral 6th rib TTP without obvious bony deformity or ecchymosis.   No other abrasion, tenderness, ecchymosis  Skin:    General: Skin is warm.     Capillary Refill: Capillary refill takes less than 2 seconds.     Comments: Good skin turgor  Neurological:     General: No focal deficit present.     Mental Status: She is alert and oriented to person, place, and time.  Psychiatric:        Mood and Affect: Mood normal.        Behavior: Behavior normal.     UC Treatments / Results  Labs (all labs ordered are listed, but only abnormal results are displayed) Labs Reviewed - No data to display  EKG   Radiology DG Ribs Unilateral W/Chest Left  Result Date: 12/09/2020 CLINICAL DATA:  Fall, left-sided rib pain EXAM: LEFT RIBS AND CHEST - 3+ VIEW COMPARISON:  None. FINDINGS: Minimally displaced fracture of the lateral left sixth rib. There is no evidence of pneumothorax or pleural effusion. Both lungs are clear. Heart size and mediastinal contours are within normal limits. IMPRESSION: Minimally displaced fracture of the lateral left sixth rib. No pneumothorax or pleural effusion. No acute abnormality of the lungs. Electronically Signed   By: Erasmo Score.D.  On: 12/09/2020 13:31    Procedures Procedures (including critical care time)  Medications Ordered in UC Medications - No data to display  Initial Impression / Assessment and Plan / UC Course  I have reviewed the triage vital signs and the nursing  notes.  Pertinent labs & imaging results that were available during my care of the patient were reviewed by me and considered in my medical decision making (see chart for details).     This patient is a very pleasant 60 y.o. year old female presenting with rib fracture. Breath sounds full throughout.  Xray L chest and ribs- Minimally displaced fracture of the lateral left sixth rib. No pneumothorax or pleural effusion. No acute abnormality of the lungs.  Zanaflex and short course of tramadol. F/u with PCP for recheck in 2 weeks.  ED return precautions discussed. Patient verbalizes understanding and agreement.    Final Clinical Impressions(s) / UC Diagnoses   Final diagnoses:  Closed fracture of one rib of left side, initial encounter     Discharge Instructions      -You unfortunately have a minimally displaced fracture of the lateral left sixth rib. No abnormality of the lungs or internal organs was seen on the xrays.  -For pain- -Start the muscle relaxer-Zanaflex (tizanidine), up to 3 times daily for muscle spasms and pain.  This can make you drowsy, so take at bedtime or when you do not need to drive or operate machinery. -Also, tramadol up to every 6 hours for pain. This mediation can also cause drowsiness. Try to avoid taking Tramadol and Zanaflex at the same time until you know how they affect your body.  -You can still take tylenol and ibuprofen for additional relief. Take Tylenol 1000 mg 3 times daily, and ibuprofen 800 mg 3 times daily with food.  You can take these together, or alternate every 3-4 hours. -Sitting, standing and walking is okay- but avoid things that cause significant pain.  -Ace wrap if you desire support -Follow-up with your primary care in about 2 weeks for recheck, particularly in things aren't improving. You can alternatively follow-up with [place].     ED Prescriptions     Medication Sig Dispense Auth. Provider   tiZANidine (ZANAFLEX) 2 MG tablet Take 1  tablet (2 mg total) by mouth every 6 (six) hours as needed for muscle spasms. 21 tablet Rhys Martini, PA-C   traMADol (ULTRAM) 50 MG tablet Take 1 tablet (50 mg total) by mouth every 6 (six) hours as needed. 15 tablet Rhys Martini, PA-C      I have reviewed the PDMP during this encounter.   Rhys Martini, PA-C 12/09/20 1447

## 2020-12-09 NOTE — ED Triage Notes (Signed)
Four days ago, Pt fell on a piece on piece of wood while walking at a vacation camp site causing a sudden onset of left sided thoracic back pain that radiates underneath her left breast bone. Notes two days of bruising on her left rib cage. Has been taking advil with some temporary relief. Sneezing, coughing, and twisting aggravate sxs. Sitting still decreases pain severity.  No other falls or injuries. No LOC with fall.

## 2021-03-02 NOTE — Progress Notes (Signed)
  Subjective:    Jo Bell - 60 y.o. female MRN [id number]  Date of birth: 16-Apr-1961  HPI  Jo Bell is to establish care.   Current issues and/or concerns: None.   ROS per HPI    Health Maintenance:  Health Maintenance Due  Topic Date Due   COVID-19 Vaccine (1) Never done   HIV Screening  Never done   COLONOSCOPY (Pts 45-53yrs Insurance coverage will need to be confirmed)  Never done   Zoster Vaccines- Shingrix (1 of 2) Never done   INFLUENZA VACCINE  Never done    Past Medical History: There are no problems to display for this patient.   Social History   reports that she has been smoking cigarettes. She has never used smokeless tobacco. She reports current alcohol use. She reports that she does not use drugs.   Family History  family history includes Breast cancer in her maternal grandmother; Breast cancer (age of onset: 61) in her mother; Diabetes in her mother.   Medications: reviewed and updated   Objective:   Physical Exam BP 121/76   Pulse 68   Temp 98.2 F (36.8 C) (Oral)   Ht 5\' 5"  (1.651 m)   Wt 150 lb 12.8 oz (68.4 kg)   LMP 06/05/2011   SpO2 93%   BMI 25.09 kg/m   Physical Exam HENT:     Head: Normocephalic and atraumatic.  Eyes:     Extraocular Movements: Extraocular movements intact.     Conjunctiva/sclera: Conjunctivae normal.     Pupils: Pupils are equal, round, and reactive to light.  Cardiovascular:     Rate and Rhythm: Normal rate and regular rhythm.     Pulses: Normal pulses.     Heart sounds: Normal heart sounds.  Pulmonary:     Effort: Pulmonary effort is normal.     Breath sounds: Normal breath sounds.  Musculoskeletal:     Cervical back: Normal range of motion and neck supple.  Neurological:     General: No focal deficit present.     Mental Status: She is alert and oriented to person, place, and time.  Psychiatric:        Mood and Affect: Mood normal.        Behavior: Behavior normal.       Assessment & Plan:   1. Encounter to establish care: - Patient presents today to establish care.  - Return for annual physical examination, labs, and health maintenance. Arrive fasting meaning having no food for at least 8 hours prior to appointment. You may have only water or black coffee. Please take scheduled medications as normal.     Patient was given clear instructions to go to Emergency Department or return to medical center if symptoms don't improve, worsen, or new problems develop.The patient verbalized understanding.  I discussed the assessment and treatment plan with the patient. The patient was provided an opportunity to ask questions and all were answered. The patient agreed with the plan and demonstrated an understanding of the instructions.   The patient was advised to call back or seek an in-person evaluation if the symptoms worsen or if the condition fails to improve as anticipated.    08/03/2011, NP 03/06/2021, 3:53 PM Primary Care at [facility]

## 2021-03-06 ENCOUNTER — Ambulatory Visit (INDEPENDENT_AMBULATORY_CARE_PROVIDER_SITE_OTHER): Payer: BC Managed Care – PPO | Admitting: Family

## 2021-03-06 ENCOUNTER — Encounter: Payer: Self-pay | Admitting: Family

## 2021-03-06 ENCOUNTER — Other Ambulatory Visit: Payer: Self-pay

## 2021-03-06 VITALS — BP 121/76 | HR 68 | Temp 98.2°F | Ht 65.0 in | Wt 150.8 lb

## 2021-03-06 DIAGNOSIS — Z7689 Persons encountering health services in other specified circumstances: Secondary | ICD-10-CM | POA: Diagnosis not present

## 2021-03-06 NOTE — Patient Instructions (Signed)
Thank you for choosing Primary Care at [facility] for your medical home!    Jo Bell was seen by Rema Fendt, NP today.   Jo Bell's primary care provider is Ricky Stabs, NP.   For the best care possible,  you should try to see Ricky Stabs, NP whenever you come to clinic.   We look forward to seeing you again soon!  If you have any questions about your visit today,  please call us at [phone]  Or feel free to reach your provider via MyChart.    Keeping you healthy   Get these tests Blood pressure- Have your blood pressure checked once a year by your healthcare provider.  Normal blood pressure is 120/80. Weight- Have your body mass index (BMI) calculated to screen for obesity.  BMI is a measure of body fat based on height and weight. You can also calculate your own BMI at https://www.west-esparza.com/. Cholesterol- Have your cholesterol checked regularly starting at age 32, sooner may be necessary if you have diabetes, high blood pressure, if a family member developed heart diseases at an early age or if you smoke.  Chlamydia, HIV, and other sexual transmitted disease- Get screened each year until the age of 104 then within three months of each new sexual partner. Diabetes- Have your blood sugar checked regularly if you have high blood pressure, high cholesterol, a family history of diabetes or if you are overweight.   Get these vaccines Flu shot- Every fall. Tetanus shot- Every 10 years. Menactra- Single dose; prevents meningitis.   Take these steps Don't smoke- If you do smoke, ask your healthcare provider about quitting. For tips on how to quit, go to www.smokefree.gov or call 1-800-QUIT-NOW. Be physically active- Exercise 5 days a week for at least 30 minutes.  If you are not already physically active start slow and gradually work up to 30 minutes of moderate physical activity.  Examples of moderate activity include walking briskly, mowing the yard, dancing,  swimming bicycling, etc. Eat a healthy diet- Eat a variety of healthy foods such as fruits, vegetables, low fat milk, low fat cheese, yogurt, lean meats, poultry, fish, beans, tofu, etc.  For more information on healthy eating, go to www.thenutritionsource.org Drink alcohol in moderation- Limit alcohol intake two drinks or less a day.  Never drink and drive. Dentist- Brush and floss teeth twice daily; visit your dentis twice a year. Depression-Your emotional health is as important as your physical health.  If you're feeling down, losing interest in things you normally enjoy please talk with your healthcare provider. Gun Safety- If you keep a gun in your home, keep it unloaded and with the safety lock on.  Bullets should be stored separately. Helmet use- Always wear a helmet when riding a motorcycle, bicycle, rollerblading or skateboarding. Safe sex- If you may be exposed to a sexually transmitted infection, use a condom Seat belts- Seat bels can save your life; always wear one. Smoke/Carbon Monoxide detectors- These detectors need to be installed on the appropriate level of your home.  Replace batteries at least once a year. Skin Cancer- When out in the sun, cover up and use sunscreen SPF 15 or higher. Violence- If anyone is threatening or hurting you, please tell your healthcare provider.

## 2021-04-03 IMAGING — MG DIGITAL SCREENING BILAT W/ TOMO W/ CAD
6 of 10 series · 6 of 30 positions shown · non-contrast
Comparison: Previous exam(s).

CLINICAL DATA: Screening.

EXAM:
DIGITAL SCREENING BILATERAL MAMMOGRAM WITH TOMO AND CAD

[R MLO synth-2D]
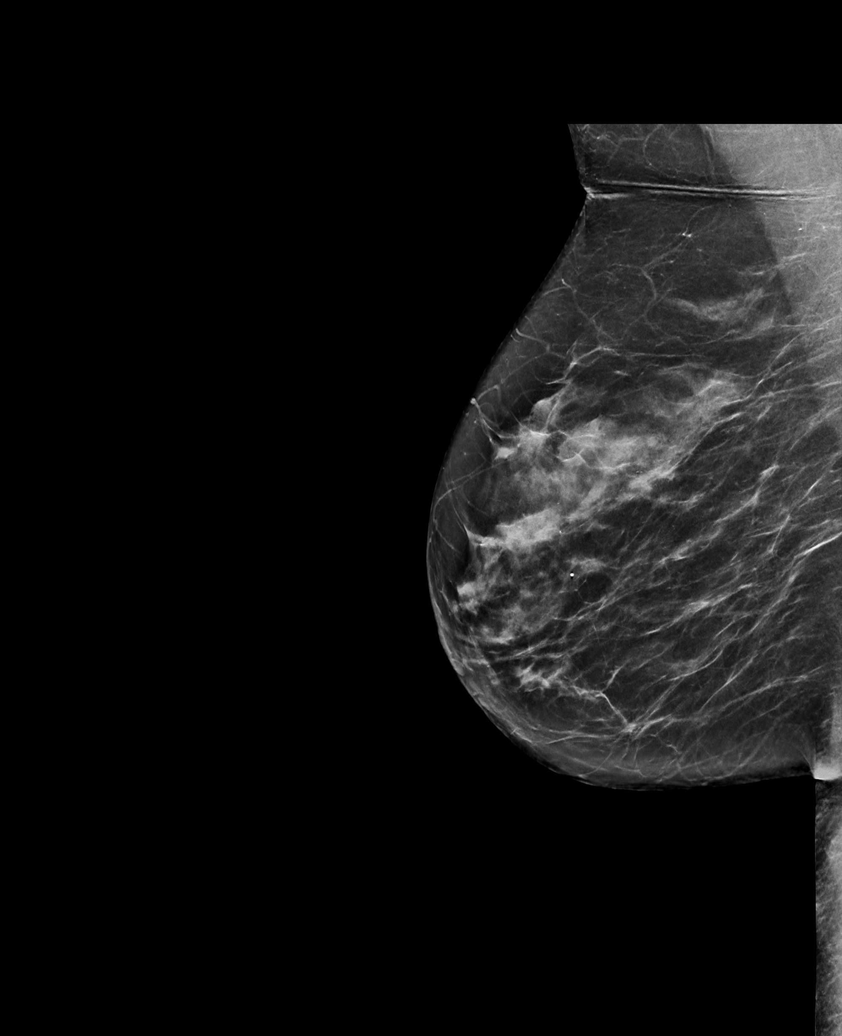

[L CC synth-2D (1 of 2)]
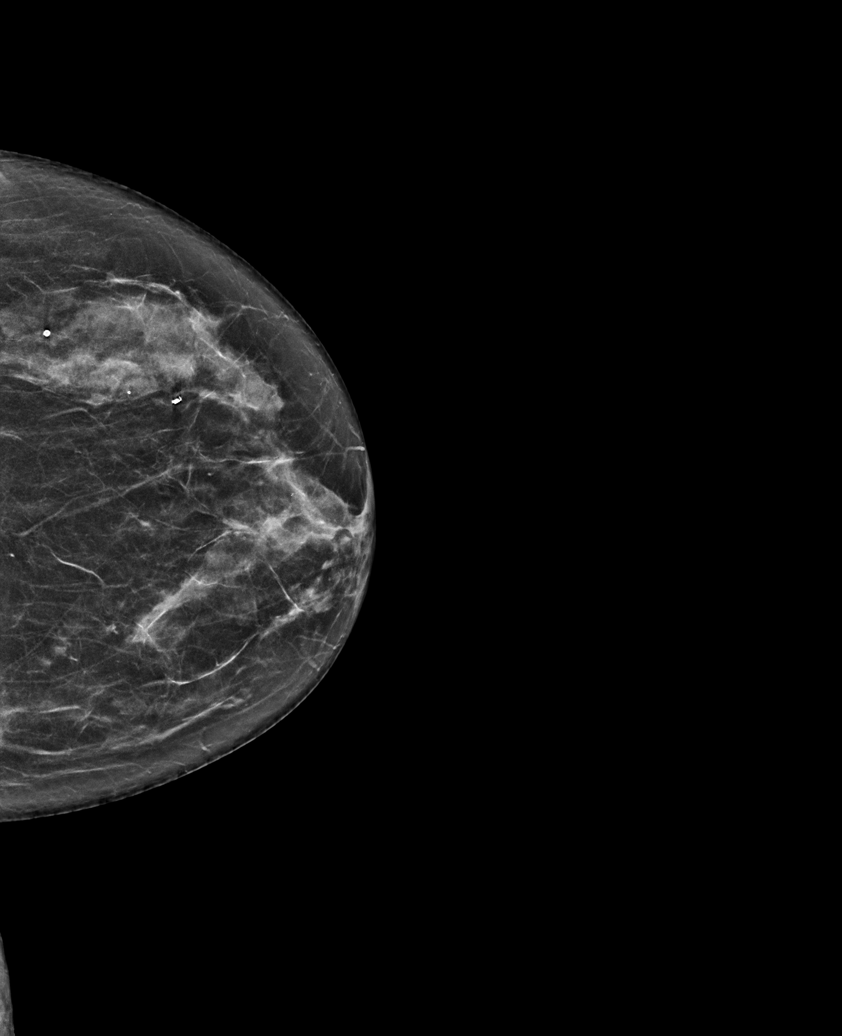

[L MLO synth-2D]
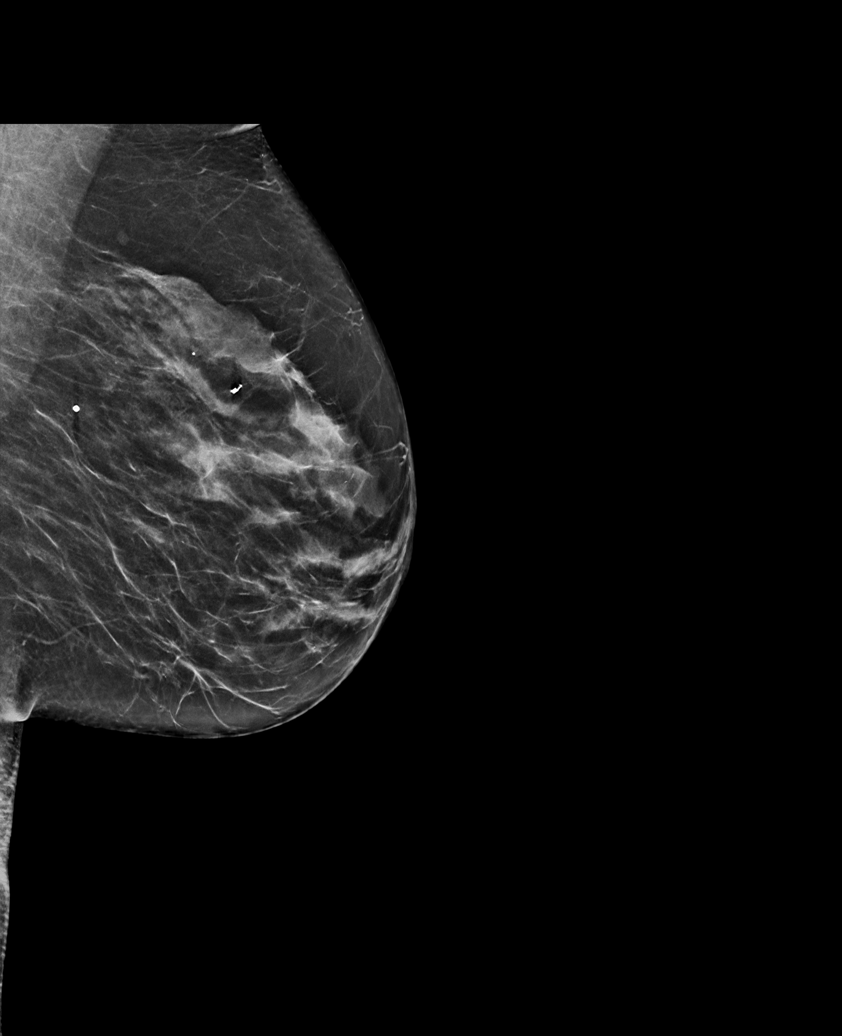

[L CC synth-2D (2 of 2)]
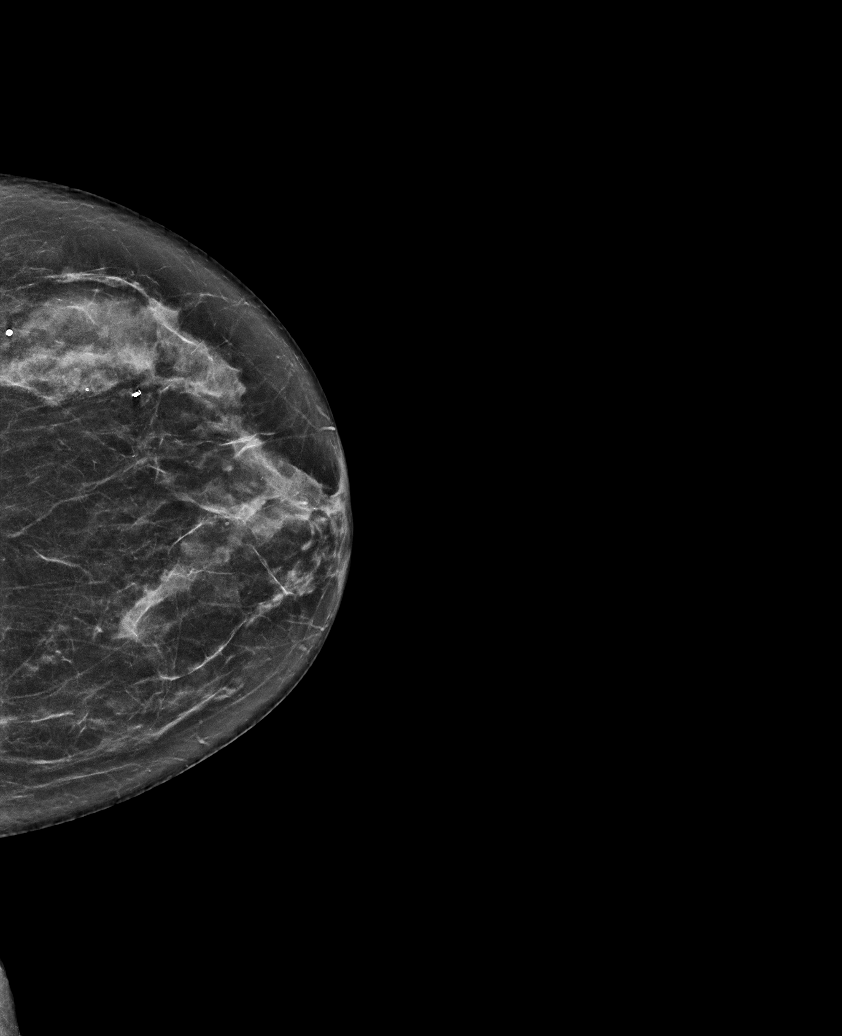

[R CC synth-2D]
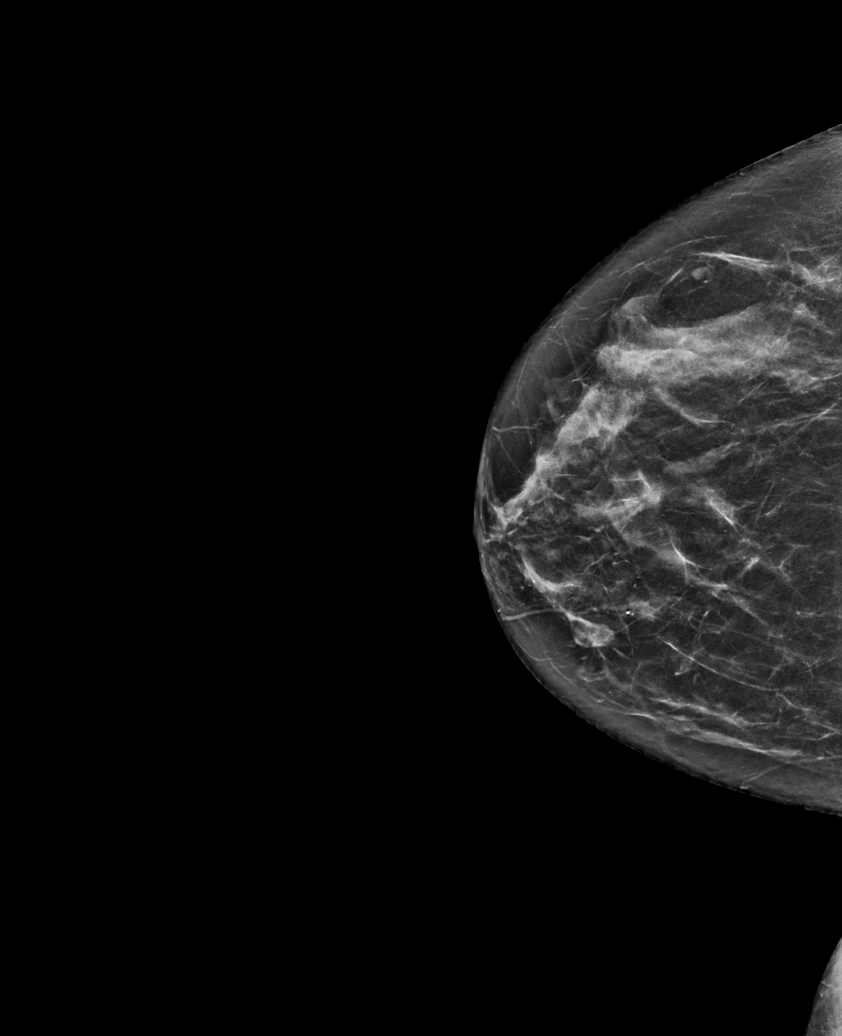

[L CC tomo · tomo slice 32/63.0]
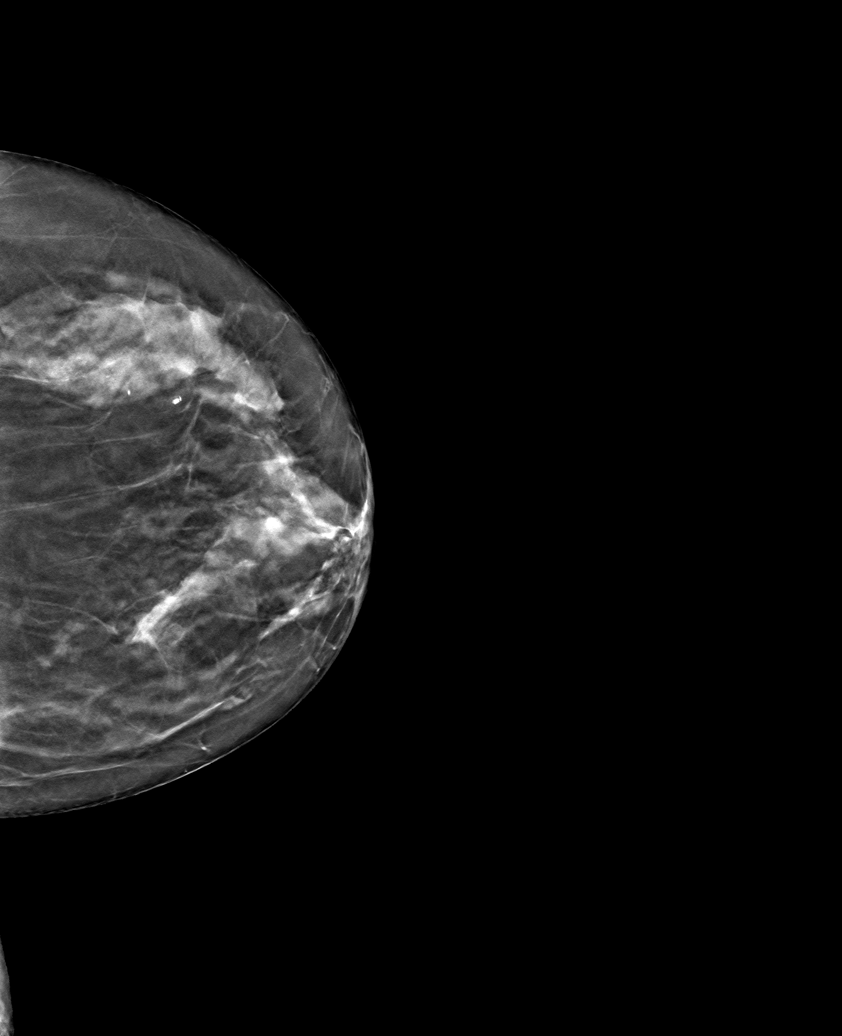

[6 of 30 positions shown; findings below may reference images not displayed]

ACR Breast Density Category c: The breast tissue is heterogeneously
dense, which may obscure small masses.
FINDINGS: There are no findings suspicious for malignancy. Images were
processed with CAD.
IMPRESSION: No mammographic evidence of malignancy. A result letter of this
screening mammogram will be mailed directly to the patient.

RECOMMENDATION:
Screening mammogram in one year. (Code:[id number])

BI-RADS CATEGORY  1: Negative.

## 2021-04-14 NOTE — Progress Notes (Signed)
Patient ID: Jo Bell, female    DOB: 1960-11-18  MRN: [id number]  CC: Annual Physical Exam  Subjective: Jo Bell is a 60 y.o. female who presents for annual physical exam.   Her concerns today include:   INSOMNIA: Ongoing for several months. Going to bed at 9 pm and awakening varying times from 12 am to 4 am. Has to be awake at 5 am for work. Around 11 am feeling tired. Reports when awakening at night her mind is racing related to all of the tasks she needs to complete. Denies anxiety and depression. Reports the only thing that works is Nyquil. Reports a friend gave her some sleeping pills but when she looked on [employer] found out it was also for anxiety depression and did not take them.  2. TOENAIL CONCERN: Left great toenail thickened with yellow discoloration. Ongoing for sometime. Reports sometimes she picks with it.   Depression screen [facility] 2/9 04/18/2021 03/06/2021  Decreased Interest 0 0  Down, Depressed, Hopeless 0 0  PHQ - 2 Score 0 0   Patient Active Problem List   Diagnosis Date Noted   Insomnia 04/18/2021     Current Outpatient Medications on File Prior to Visit  Medication Sig Dispense Refill   Cholecalciferol (VITAMIN D3 PO) Take by mouth.     Omega-3 Fatty Acids (OMEGA-3 FISH OIL PO) Take by mouth.     No current facility-administered medications on file prior to visit.    Allergies  Allergen Reactions   Novocain [Procaine]     swelling   Penicillins Other (See Comments)    Social History   Socioeconomic History   Marital status: Married    Spouse name: Not on file   Number of children: Not on file   Years of education: Not on file   Highest education level: Not on file  Occupational History   Not on file  Tobacco Use   Smoking status: Every Day    Years: 20.00    Types: Cigarettes   Smokeless tobacco: Never  Substance and Sexual Activity   Alcohol use: Yes    Alcohol/week: 0.0 - 5.0 standard drinks   Drug use: No   Sexual activity: Yes     Partners: Male    Birth control/protection: Post-menopausal  Other Topics Concern   Not on file  Social History Narrative   Not on file   Social Determinants of Health   Financial Resource Strain: Not on file  Food Insecurity: Not on file  Transportation Needs: Not on file  Physical Activity: Not on file  Stress: Not on file  Social Connections: Not on file  Intimate Partner Violence: Not on file    Family History  Problem Relation Age of Onset   Breast cancer Mother 6       no treatment yet   Diabetes Mother    Breast cancer Maternal Grandmother        maybe age 71's    Past Surgical History:  Procedure Laterality Date   BREAST BIOPSY Left 07/26/13   fibrocystic changes   TONSILLECTOMY AND ADENOIDECTOMY  age 57   TUBAL LIGATION  1993    ROS: Review of Systems Negative except as stated above  PHYSICAL EXAM: BP 137/80 (BP Location: Left Arm, Patient Position: Sitting, Cuff Size: Normal)   Pulse 64   Temp 98.6 F (37 C)   Resp 18   Ht $R'5\' 5"'uP$  (1.651 m)   Wt 154 lb 9.6 oz (70.1 kg)  LMP 06/05/2011   SpO2 95%   BMI 25.73 kg/m   Physical Exam HENT:     Head: Normocephalic and atraumatic.     Right Ear: Tympanic membrane, ear canal and external ear normal. There is no impacted cerumen.     Left Ear: Tympanic membrane, ear canal and external ear normal.     Nose: Nose normal.     Mouth/Throat:     Mouth: Mucous membranes are moist.     Pharynx: Oropharynx is clear.  Eyes:     Extraocular Movements: Extraocular movements intact.     Conjunctiva/sclera: Conjunctivae normal.     Pupils: Pupils are equal, round, and reactive to light.  Cardiovascular:     Rate and Rhythm: Normal rate and regular rhythm.     Pulses: Normal pulses.     Heart sounds: Normal heart sounds.  Pulmonary:     Effort: Pulmonary effort is normal.     Breath sounds: Normal breath sounds.  Chest:     Comments: Patient declined exam. Abdominal:     General: Bowel sounds are  normal.     Palpations: Abdomen is soft.  Genitourinary:    Comments: Patient declined exam.  Musculoskeletal:        General: Normal range of motion.     Cervical back: Normal range of motion and neck supple.  Feet:     Comments: Left great toenail yellow-white and thickened. No evidence of drainage. Skin:    General: Skin is warm and dry.     Capillary Refill: Capillary refill takes less than 2 seconds.  Neurological:     General: No focal deficit present.     Mental Status: She is alert and oriented to person, place, and time.  Psychiatric:        Mood and Affect: Mood normal.        Behavior: Behavior normal.    ASSESSMENT AND PLAN: 1. Annual physical exam: - Counseled on 150 minutes of exercise per week as tolerated, healthy eating (including decreased daily intake of saturated fats, cholesterol, added sugars, sodium), STI prevention, and routine healthcare maintenance.  2. Screening for metabolic disorder: - QVZ56+LOVF to check kidney function, liver function, and electrolyte balance.  - CMP14+EGFR  3. Screening for deficiency anemia: - CBC to screen for anemia. - CBC  4. Diabetes mellitus screening: - Hemoglobin A1c to screen for pre-diabetes/diabetes. - Hemoglobin A1c  5. Screening cholesterol level: - Lipid panel to screen for high cholesterol.  - Lipid panel  6. Thyroid disorder screen: - TSH to check thyroid function.  - TSH  7. Encounter for screening mammogram for malignant neoplasm of breast: 8. Pap smear for cervical cancer screening: 9. Routine screening for STI (sexually transmitted infection): - Patient declined. Patient reports managed by Obstetrics Gynecology.   10. Encounter for screening for HIV: - HIV antibody to screen for human immunodeficiency virus.  - HIV antibody (with reflex)  11. Colon cancer screening: - Patient declined. Patient reports last colonoscopy 2021, three polyps removed and no recommendation for follow-up.  12.  Insomnia, unspecified type: - Begin Trazodone as prescribed, counseled on medication compliance and adverse effects. May cause drowsiness. Counseled patient to not consume if operating heavy machinery or driving. Counseled patient to not consume with alcohol or illicit substances. Patient verbalized understanding.  - Follow-up with primary provider in 4 weeks or sooner if needed.  - traZODone (DESYREL) 50 MG tablet; Take 1 tablet (50 mg total) by mouth at bedtime.  Dispense: 30  tablet; Refill: 0  13. Toenail fungus: - Referral to Podiatry for further evaluation and management.  - Ambulatory referral to Podiatry   Patient was given the opportunity to ask questions.  Patient verbalized understanding of the plan and was able to repeat key elements of the plan. Patient was given clear instructions to go to Emergency Department or return to medical center if symptoms don't improve, worsen, or new problems develop.The patient verbalized understanding.   Orders Placed This Encounter  Procedures   HIV antibody (with reflex)   CBC   Lipid panel   TSH   CMP14+EGFR   Hemoglobin A1c   Ambulatory referral to Podiatry     Requested Prescriptions   Signed Prescriptions Disp Refills   traZODone (DESYREL) 50 MG tablet 30 tablet 0    Sig: Take 1 tablet (50 mg total) by mouth at bedtime.    Return in about 1 year (around 04/18/2022) for Physical per patient preference and 4 weeks insomnia.  Camillia Herter, NP

## 2021-04-18 ENCOUNTER — Other Ambulatory Visit: Payer: Self-pay

## 2021-04-18 ENCOUNTER — Ambulatory Visit (INDEPENDENT_AMBULATORY_CARE_PROVIDER_SITE_OTHER): Payer: BC Managed Care – PPO | Admitting: Family

## 2021-04-18 VITALS — BP 137/80 | HR 64 | Temp 98.6°F | Resp 18 | Ht 65.0 in | Wt 154.6 lb

## 2021-04-18 DIAGNOSIS — Z13220 Encounter for screening for lipoid disorders: Secondary | ICD-10-CM

## 2021-04-18 DIAGNOSIS — Z114 Encounter for screening for human immunodeficiency virus [HIV]: Secondary | ICD-10-CM | POA: Diagnosis not present

## 2021-04-18 DIAGNOSIS — G4700 Insomnia, unspecified: Secondary | ICD-10-CM

## 2021-04-18 DIAGNOSIS — Z1329 Encounter for screening for other suspected endocrine disorder: Secondary | ICD-10-CM

## 2021-04-18 DIAGNOSIS — Z130 Encounter for screening for diseases of the blood and blood-forming organs and certain disorders involving the immune mechanism: Secondary | ICD-10-CM | POA: Diagnosis not present

## 2021-04-18 DIAGNOSIS — Z0000 Encounter for general adult medical examination without abnormal findings: Secondary | ICD-10-CM | POA: Diagnosis not present

## 2021-04-18 DIAGNOSIS — B351 Tinea unguium: Secondary | ICD-10-CM

## 2021-04-18 DIAGNOSIS — Z13228 Encounter for screening for other metabolic disorders: Secondary | ICD-10-CM

## 2021-04-18 DIAGNOSIS — Z113 Encounter for screening for infections with a predominantly sexual mode of transmission: Secondary | ICD-10-CM

## 2021-04-18 DIAGNOSIS — Z131 Encounter for screening for diabetes mellitus: Secondary | ICD-10-CM | POA: Diagnosis not present

## 2021-04-18 DIAGNOSIS — Z124 Encounter for screening for malignant neoplasm of cervix: Secondary | ICD-10-CM

## 2021-04-18 DIAGNOSIS — Z1231 Encounter for screening mammogram for malignant neoplasm of breast: Secondary | ICD-10-CM

## 2021-04-18 DIAGNOSIS — Z1211 Encounter for screening for malignant neoplasm of colon: Secondary | ICD-10-CM

## 2021-04-18 MED ORDER — TRAZODONE HCL 50 MG PO TABS
50.0000 mg | ORAL_TABLET | Freq: Every day | ORAL | 0 refills | Status: DC
Start: 2021-04-18 — End: 2021-05-17

## 2021-04-18 NOTE — Progress Notes (Signed)
Pt presents for annual physical exam declined pap, [facility], pt has complaints of insomnia, states that its causing exhaustion. Pt left big toenail thick and white looking

## 2021-04-18 NOTE — Patient Instructions (Signed)
Preventive Care 92-58 Years Old, Female Preventive care refers to lifestyle choices and visits with your health care provider that can promote health and wellness. Preventive care visits are also called wellness exams. What can I expect for my preventive care visit? Counseling Your health care provider may ask you questions about your: Medical history, including: Past medical problems. Family medical history. Pregnancy history. Current health, including: Menstrual cycle. Method of birth control. Emotional well-being. Home life and relationship well-being. Sexual activity and sexual health. Lifestyle, including: Alcohol, nicotine or tobacco, and drug use. Access to firearms. Diet, exercise, and sleep habits. Work and work Statistician. Sunscreen use. Safety issues such as seatbelt and bike helmet use. Physical exam Your health care provider will check your: Height and weight. These may be used to calculate your BMI (body mass index). BMI is a measurement that tells if you are at a healthy weight. Waist circumference. This measures the distance around your waistline. This measurement also tells if you are at a healthy weight and may help predict your risk of certain diseases, such as type 2 diabetes and high blood pressure. Heart rate and blood pressure. Body temperature. Skin for abnormal spots. What immunizations do I need? Vaccines are usually given at various ages, according to a schedule. Your health care provider will recommend vaccines for you based on your age, medical history, and lifestyle or other factors, such as travel or where you work. What tests do I need? Screening Your health care provider may recommend screening tests for certain conditions. This may include: Lipid and cholesterol levels. Diabetes screening. This is done by checking your blood sugar (glucose) after you have not eaten for a while (fasting). Pelvic exam and Pap test. Hepatitis B test. Hepatitis C  test. HIV (human immunodeficiency virus) test. STI (sexually transmitted infection) testing, if you are at risk. Lung cancer screening. Colorectal cancer screening. Mammogram. Talk with your health care provider about when you should start having regular mammograms. This may depend on whether you have a family history of breast cancer. BRCA-related cancer screening. This may be done if you have a family history of breast, ovarian, tubal, or peritoneal cancers. Bone density scan. This is done to screen for osteoporosis. Talk with your health care provider about your test results, treatment options, and if necessary, the need for more tests. Follow these instructions at home: Eating and drinking  Eat a diet that includes fresh fruits and vegetables, whole grains, lean protein, and low-fat dairy products. Take vitamin and mineral supplements as recommended by your health care provider. Do not drink alcohol if: Your health care provider tells you not to drink. You are pregnant, may be pregnant, or are planning to become pregnant. If you drink alcohol: Limit how much you have to 0-1 drink a day. Know how much alcohol is in your drink. In the [place] one drink equals one 12 oz bottle of beer (355 mL), one 5 oz glass of wine (148 mL), or one 1 oz glass of hard liquor (44 mL). Lifestyle Brush your teeth every morning and night with fluoride toothpaste. Floss one time each day. Exercise for at least 30 minutes 5 or more days each week. Do not use any products that contain nicotine or tobacco. These products include cigarettes, chewing tobacco, and vaping devices, such as e-cigarettes. If you need help quitting, ask your health care provider. Do not use drugs. If you are sexually active, practice safe sex. Use a condom or other form of protection to prevent  STIs. If you do not wish to become pregnant, use a form of birth control. If you plan to become pregnant, see your health care provider for a  prepregnancy visit. Take aspirin only as told by your health care provider. Make sure that you understand how much to take and what form to take. Work with your health care provider to find out whether it is safe and beneficial for you to take aspirin daily. Find healthy ways to manage stress, such as: Meditation, yoga, or listening to music. Journaling. Talking to a trusted person. Spending time with friends and family. Minimize exposure to UV radiation to reduce your risk of skin cancer. Safety Always wear your seat belt while driving or riding in a vehicle. Do not drive: If you have been drinking alcohol. Do not ride with someone who has been drinking. When you are tired or distracted. While texting. If you have been using any mind-altering substances or drugs. Wear a helmet and other protective equipment during sports activities. If you have firearms in your house, make sure you follow all gun safety procedures. Seek help if you have been physically or sexually abused. What's next? Visit your health care provider once a year for an annual wellness visit. Ask your health care provider how often you should have your eyes and teeth checked. Stay up to date on all vaccines. This information is not intended to replace advice given to you by your health care provider. Make sure you discuss any questions you have with your health care provider. Document Revised: 11/14/2020 Document Reviewed: 11/14/2020 Elsevier Patient Education  [employer].

## 2021-04-19 ENCOUNTER — Other Ambulatory Visit: Payer: Self-pay | Admitting: Family

## 2021-04-19 DIAGNOSIS — E785 Hyperlipidemia, unspecified: Secondary | ICD-10-CM

## 2021-04-19 LAB — CMP14+EGFR
ALT: 14 IU/L (ref 0–32)
AST: 15 IU/L (ref 0–40)
Albumin/Globulin Ratio: 1.9 (ref 1.2–2.2)
Albumin: 4.5 g/dL (ref 3.8–4.9)
Alkaline Phosphatase: 79 IU/L (ref 44–121)
BUN/Creatinine Ratio: 11 (ref 9–23)
BUN: 9 mg/dL (ref 6–24)
Bilirubin Total: 0.5 mg/dL (ref 0.0–1.2)
CO2: 21 mmol/L (ref 20–29)
Calcium: 9.5 mg/dL (ref 8.7–10.2)
Chloride: 104 mmol/L (ref 96–106)
Creatinine, Ser: 0.84 mg/dL (ref 0.57–1.00)
Globulin, Total: 2.4 g/dL (ref 1.5–4.5)
Glucose: 92 mg/dL (ref 70–99)
Potassium: 4.5 mmol/L (ref 3.5–5.2)
Sodium: 141 mmol/L (ref 134–144)
Total Protein: 6.9 g/dL (ref 6.0–8.5)
eGFR: 80 mL/min/{1.73_m2} (ref >59)

## 2021-04-19 LAB — CBC

## 2021-04-19 LAB — TSH: TSH: 2.68 u[IU]/mL (ref 0.450–4.500)

## 2021-04-19 LAB — LIPID PANEL
Chol/HDL Ratio: 3.8 ratio (ref 0.0–4.4)
Cholesterol, Total: 259 mg/dL — ABNORMAL HIGH (ref 100–199)
HDL: 69 mg/dL (ref >39)
LDL Chol Calc (NIH): 176 mg/dL — ABNORMAL HIGH (ref 0–99)
Triglycerides: 82 mg/dL (ref 0–149)
VLDL Cholesterol Cal: 14 mg/dL (ref 5–40)

## 2021-04-19 LAB — HEMOGLOBIN A1C
Est. average glucose Bld gHb Est-mCnc: 114 mg/dL
Hgb A1c MFr Bld: 5.6 % (ref 4.8–5.6)

## 2021-04-19 LAB — HIV ANTIBODY (ROUTINE TESTING W REFLEX): HIV Screen 4th Generation wRfx: NONREACTIVE

## 2021-04-19 MED ORDER — ATORVASTATIN CALCIUM 20 MG PO TABS: 20.0000 mg | ORAL_TABLET | Freq: Every day | ORAL | 0 refills | Status: DC

## 2021-04-19 NOTE — Progress Notes (Signed)
Kidney function normal.  Liver function normal.   Thyroid function normal.   No diabetes.   No anemia.   HIV negative.   Cholesterol higher than expected. High cholesterol may increase risk of heart attack and/or stroke. Consider eating more fruits, vegetables, and lean baked meats such as chicken or fish. Moderate intensity exercise at least 150 minutes as tolerated per week may help as well.   Begin Atorvastatin (Lipitor) as prescribed. Encouraged to recheck fasting cholesterol at lab only appointment in 4 to 6 weeks.   Per [facility] we will need to recollect CBC. Please call our office and schedule lab only appointment to have recollected.   The following is for provider reference only: The 10-year ASCVD risk score (Arnett DK, et al., 2019) is: 7.1%   Values used to calculate the score:     Age: 60 years     Sex: Female     Is Non-Hispanic African American: No     Diabetic: No     Tobacco smoker: Yes     Systolic Blood Pressure: 137 mmHg     Is BP treated: No     HDL Cholesterol: 69 mg/dL     Total Cholesterol: 259 mg/dL

## 2021-04-23 ENCOUNTER — Ambulatory Visit: Payer: BC Managed Care – PPO | Admitting: Podiatry

## 2021-05-02 ENCOUNTER — Ambulatory Visit (INDEPENDENT_AMBULATORY_CARE_PROVIDER_SITE_OTHER): Payer: BC Managed Care – PPO | Admitting: Podiatry

## 2021-05-02 ENCOUNTER — Ambulatory Visit: Payer: BC Managed Care – PPO | Admitting: Nurse Practitioner

## 2021-05-02 ENCOUNTER — Other Ambulatory Visit: Payer: Self-pay

## 2021-05-02 VITALS — BP 124/77 | HR 74 | Temp 97.7°F

## 2021-05-02 DIAGNOSIS — M79675 Pain in left toe(s): Secondary | ICD-10-CM

## 2021-05-02 DIAGNOSIS — L600 Ingrowing nail: Secondary | ICD-10-CM | POA: Diagnosis not present

## 2021-05-02 DIAGNOSIS — B070 Plantar wart: Secondary | ICD-10-CM | POA: Diagnosis not present

## 2021-05-02 DIAGNOSIS — B351 Tinea unguium: Secondary | ICD-10-CM

## 2021-05-02 NOTE — Patient Instructions (Addendum)
For the big toe:  Place 1/4 cup of epsom salts in a quart of warm tap water.  Submerge your foot or feet in the solution and soak for 20 minutes.  This soak should be done twice a day.  Next, remove your foot or feet from solution, blot dry the affected area. Apply ointment and cover if instructed by your doctor.   IF YOUR SKIN BECOMES IRRITATED WHILE USING THESE INSTRUCTIONS, IT IS OKAY TO SWITCH TO  WHITE VINEGAR AND WATER.  As another alternative soak, you may use antibacterial soap and water.  Monitor for any signs/symptoms of infection. Call the office immediately if any occur or go directly to the emergency room. Call with any questions/concerns.  ---  For the 5th toe:  Take dressing off in 8 hours and wash the foot with soap and water. If it is hurting or becomes uncomfortable before the 8 hours, go ahead and remove the bandage and wash the area.  If it blisters, apply antibiotic ointment and a band-aid.  Monitor for any signs/symptoms of infection. Call the office immediately if any occur or go directly to the emergency room. Call with any questions/concerns.

## 2021-05-05 NOTE — Progress Notes (Signed)
Subjective:   Patient ID: Jo Bell, female   DOB: 60 y.o.   MRN: [id number]   HPI 60 year old female presents the office today with concerns of her left big toenail becoming thickened discolored.  Small for the last 2 years with becoming more tender.  There is pressure with shoes.  She tried soaking in Epson salts.  She does not like the way her toenail looks either.  She is tried numerous over-the-counter medication without any significant improvement.  No drainage or pus.   Also has secondary concerns of a painful skin lesion on the left fifth toe, medial aspect.  The area is tender and she is tried keeping a separator between the toes but still having pain.  No swelling or redness.  No injury.    No other concerns today.   Review of Systems  All other systems reviewed and are negative.  Past Medical History:  Diagnosis Date   Abnormal Pap smear 1993   after childbirth with cryo, normal since   Open fracture of leg age 31   bar went through right leg    Past Surgical History:  Procedure Laterality Date   BREAST BIOPSY Left 07/26/13   fibrocystic changes   TONSILLECTOMY AND ADENOIDECTOMY  age 5   TUBAL LIGATION  1993     Current Outpatient Medications:    atorvastatin (LIPITOR) 20 MG tablet, Take 1 tablet (20 mg total) by mouth daily., Disp: 90 tablet, Rfl: 0   Cholecalciferol (VITAMIN D3 PO), Take by mouth., Disp: , Rfl:    Omega-3 Fatty Acids (OMEGA-3 FISH OIL PO), Take by mouth., Disp: , Rfl:    traZODone (DESYREL) 50 MG tablet, Take 1 tablet (50 mg total) by mouth at bedtime., Disp: 30 tablet, Rfl: 0  Allergies  Allergen Reactions   Novocain [Procaine]     swelling   Penicillins Other (See Comments)          Objective:  Physical Exam  General: AAO x3, NAD  Dermatological: Left hallux toenails hypertrophic, dystrophic with yellow discoloration causing incurvation of the nails as well given the dystrophy and thickening of the nail.  There is tenderness  palpation on the entire toenail.  There is no edema, erythema or any signs of infection.  Medial aspect of the fifth toe is a hyperkeratotic lesion and there is pinpoint bleeding with evidence of verruca.  No open lesions.  Vascular: Dorsalis Pedis artery and Posterior Tibial artery pedal pulses are 2/4 bilateral with immedate capillary fill time.  There is no pain with calf compression, swelling, warmth, erythema.   Neruologic: Grossly intact via light touch bilateral.   Musculoskeletal: Tenderness to the left hallux toenail but no other areas of discomfort.  Muscular strength 5/5 in all groups tested bilateral.  Gait: Unassisted, Nonantalgic.       Assessment:   Left hallux onychomycosis, ingrown toenail causing pain; verruca left fifth toe     Plan:  -Treatment options discussed including all alternatives, risks, and complications -Etiology of symptoms were discussed -We discussed treatment options including trying to save the nail versus nail removal.  After discussion she elects to proceed with nail removal in total.  Discussed with her there is no guarantee Come back in normal but given her pain she wants to have it removed. -At this time, decided to proceed with total nail removal without chemical matricectomy to the left hallux toenail due to pain. Risks and complications were discussed with the patient for which they understand and  verbally consent to the procedure. Under sterile conditions a total of 3 mL of bupivacaine plain was infiltrated in a hallux block fashion. Once anesthetized, the skin was prepped in sterile fashion. A tourniquet was then applied. Next the left hallux toenail was removed in total making sure to move all nail borders. Once the nail was removed, the area was debrided and the underlying skin was intact. The area was irrigated and hemostasis was obtained.  A dry sterile dressing was applied. After application of the dressing the tourniquet was removed and there  is found to be an immediate capillary refill time to the digit. The patient tolerated the procedure well any complications. Post procedure instructions were discussed the patient for which he verbally understood. Follow-up in one week for nail check or sooner if any problems are to arise. Discussed signs/symptoms of worsening infection and directed to call the office immediately should any occur or go directly to the emergency room. In the meantime, encouraged to call the office with any questions, concerns, changes symptoms. -We will wait for the nail to come back in case of further nail fungus treatment. -For the skin lesion on the left foot. Complications or bleeding.  Cantharone was applied followed by an occlusive bandage.  Post procedure instructions discussed.  Monitor for any signs or symptoms of infection.  Vivi Barrack DPM

## 2021-05-06 NOTE — Progress Notes (Deleted)
60 y.o. [id number] Married White or Caucasian Not Hispanic or Latino female here for annual exam.      Patient's last menstrual period was 06/05/2011.          Sexually active: {yes no:314532}  The current method of family planning is {contraception:315051}.    Exercising: {yes no:314532}  {types:19826} Smoker:  {YES NO:22349}  Health Maintenance: Pap: 11/29/21WNL  01-01-17 neg HPV HR neg History of abnormal Pap:  yes cryo in the past  MMG:  09/26/19 density C Bi-rads 1 neg  BMD:   none  Colonoscopy: none  TDaP:  03/09/13  Gardasil: n/a   reports that she has been smoking cigarettes. She has never used smokeless tobacco. She reports current alcohol use. She reports that she does not use drugs.  Past Medical History:  Diagnosis Date   Abnormal Pap smear 1993   after childbirth with cryo, normal since   Open fracture of leg age 62   bar went through right leg    Past Surgical History:  Procedure Laterality Date   BREAST BIOPSY Left 07/26/13   fibrocystic changes   TONSILLECTOMY AND ADENOIDECTOMY  age 40   TUBAL LIGATION  1993    Current Outpatient Medications  Medication Sig Dispense Refill   atorvastatin (LIPITOR) 20 MG tablet Take 1 tablet (20 mg total) by mouth daily. 90 tablet 0   Cholecalciferol (VITAMIN D3 PO) Take by mouth.     Omega-3 Fatty Acids (OMEGA-3 FISH OIL PO) Take by mouth.     traZODone (DESYREL) 50 MG tablet Take 1 tablet (50 mg total) by mouth at bedtime. 30 tablet 0   No current facility-administered medications for this visit.    Family History  Problem Relation Age of Onset   Breast cancer Mother 38       no treatment yet   Diabetes Mother    Breast cancer Maternal Grandmother        maybe age 28's    Review of Systems  Exam:   LMP 06/05/2011   Weight change: @WEIGHTCHANGE @ Height:      Ht Readings from Last 3 Encounters:  04/18/21 5\' 5"  (1.651 m)  03/06/21 5\' 5"  (1.651 m)  04/30/20 5' 4.25" (1.632 m)    General appearance: alert,  cooperative and appears stated age Head: Normocephalic, without obvious abnormality, atraumatic Neck: no adenopathy, supple, symmetrical, trachea midline and thyroid {CHL AMB PHY EX THYROID NORM DEFAULT:[phone]::"normal to inspection and palpation"} Lungs: clear to auscultation bilaterally Cardiovascular: regular rate and rhythm Breasts: {Exam; breast:13139::"normal appearance, no masses or tenderness"} Abdomen: soft, non-tender; non distended,  no masses,  no organomegaly Extremities: extremities normal, atraumatic, no cyanosis or edema Skin: Skin color, texture, turgor normal. No rashes or lesions Lymph nodes: Cervical, supraclavicular, and axillary nodes normal. No abnormal inguinal nodes palpated Neurologic: Grossly normal   Pelvic: External genitalia:  no lesions              Urethra:  normal appearing urethra with no masses, tenderness or lesions              Bartholins and Skenes: normal                 Vagina: normal appearing vagina with normal color and discharge, no lesions              Cervix: {CHL AMB PHY EX CERVIX NORM DEFAULT:[phone]::"no lesions"}               Bimanual Exam:  Uterus:  {  CHL AMB PHY EX UTERUS NORM DEFAULT:[phone]::"normal size, contour, position, consistency, mobility, non-tender"}              Adnexa: {CHL AMB PHY EX ADNEXA NO MASS DEFAULT:[phone]::"no mass, fullness, tenderness"}               Rectovaginal: Confirms               Anus:  normal sphincter tone, no lesions  *** chaperoned for the exam.  A:  Well Woman with normal exam  P:

## 2021-05-07 ENCOUNTER — Ambulatory Visit: Payer: BC Managed Care – PPO | Admitting: Obstetrics and Gynecology

## 2021-05-13 ENCOUNTER — Telehealth (INDEPENDENT_AMBULATORY_CARE_PROVIDER_SITE_OTHER): Payer: BC Managed Care – PPO | Admitting: Nurse Practitioner

## 2021-05-13 ENCOUNTER — Other Ambulatory Visit: Payer: Self-pay

## 2021-05-13 DIAGNOSIS — R059 Cough, unspecified: Secondary | ICD-10-CM | POA: Diagnosis not present

## 2021-05-13 DIAGNOSIS — J069 Acute upper respiratory infection, unspecified: Secondary | ICD-10-CM

## 2021-05-13 MED ORDER — AZITHROMYCIN 250 MG PO TABS: ORAL_TABLET | ORAL | 0 refills | Status: AC

## 2021-05-13 MED ORDER — FLUTICASONE PROPIONATE 50 MCG/ACT NA SUSP: 2.0000 | Freq: Every day | NASAL | 6 refills | Status: DC

## 2021-05-13 MED ORDER — LEVOCETIRIZINE DIHYDROCHLORIDE 5 MG PO TABS
5.0000 mg | ORAL_TABLET | Freq: Every evening | ORAL | 0 refills | Status: DC
Start: 2021-05-13 — End: 2021-09-30

## 2021-05-13 NOTE — Progress Notes (Signed)
Virtual Visit via Telephone Note  I connected with Jo Bell on 05/15/21 at  2:40 PM EST by telephone and verified that I am speaking with the correct person using two identifiers.  Location: Patient: home Provider: office   I discussed the limitations, risks, security and privacy concerns of performing an evaluation and management service by telephone and the availability of in person appointments. I also discussed with the patient that there may be a patient responsible charge related to this service. The patient expressed understanding and agreed to proceed.   History of Present Illness:  Patient presents today for sick visit through telephone visit.  She states for the past 2 weeks she has been having clear sinus drainage, sore throat due to postnasal drip, sinus pressure and pain.  She has taken 3 home COVID test that were all negative.  She states that her symptoms are worse in the mornings.  She has been taking Mucinex and DayQuil with minimal relief noted. Denies f/c/s, n/v/d, hemoptysis, PND, chest pain or edema.     Observations/Objective:  Vitals with BMI 05/02/2021 04/18/2021 03/06/2021  Height - 5\' 5"  -  Weight - 154 lbs 10 oz -  BMI - 25.73 -  Systolic 124 137  Diastolic 77 80 76  Pulse 74 64 -      Assessment and Plan:  URI Cough:   Stay well hydrated  Stay active  Deep breathing exercises  May take tylenol or fever or pain  Will order azithromycin  Will order Flonase  Will order xyzal    Follow up:  Follow up if needed    I discussed the assessment and treatment plan with the patient. The patient was provided an opportunity to ask questions and all were answered. The patient agreed with the plan and demonstrated an understanding of the instructions.   The patient was advised to call back or seek an in-person evaluation if the symptoms worsen or if the condition fails to improve as anticipated.  I provided 23 minutes of  non-face-to-face time during this encounter.   161, NP

## 2021-05-13 NOTE — Patient Instructions (Addendum)
URI Cough:   Stay well hydrated  Stay active  Deep breathing exercises  May take tylenol or fever or pain  Will order azithromycin  Will order Flonase  Will order xyzal    Follow up:  Follow up if needed

## 2021-05-13 NOTE — Progress Notes (Signed)
Virtual Visit via Telephone Note  I connected with Jo Bell, on 05/17/2021 at 10:50 AM by telephone due to the COVID-19 pandemic and verified that I am speaking with the correct person using two identifiers.  Due to current restrictions/limitations of in-office visits due to the COVID-19 pandemic, this scheduled clinical appointment was converted to a telehealth visit.   Consent: I discussed the limitations, risks, security and privacy concerns of performing an evaluation and management service by telephone and the availability of in person appointments. I also discussed with the patient that there may be a patient responsible charge related to this service. The patient expressed understanding and agreed to proceed.   Location of Patient: Home  Location of Provider: [facility]   Persons participating in Telemedicine visit: Magdalina R Sandrea Matte, NP Margorie John, CMA   History of Present Illness: Jo Bell is a 60 year-old female who presents for insomnia follow-up.  Her concerns today include:   INSOMNIA FOLLOW-UP: 04/18/2021: - Begin Trazodone as prescribed  05/17/2021: Doing well on current regimen, no issues/concerns. Not currently taking related to has a cold, recent appointment 05/13/2021. Reports medications from that appointment helping as well.   Past Medical History:  Diagnosis Date   Abnormal Pap smear 1993   after childbirth with cryo, normal since   Open fracture of leg age 80   bar went through right leg   Allergies  Allergen Reactions   Novocain [Procaine]     swelling   Penicillins Other (See Comments)    Current Outpatient Medications on File Prior to Visit  Medication Sig Dispense Refill   atorvastatin (LIPITOR) 20 MG tablet Take 1 tablet (20 mg total) by mouth daily. 90 tablet 0   azithromycin (ZITHROMAX) 250 MG tablet Take 2 tablets on day 1, then 1 tablet daily on days 2 through 5 6 tablet 0    Cholecalciferol (VITAMIN D3 PO) Take by mouth.     fluticasone (FLONASE) 50 MCG/ACT nasal spray Place 2 sprays into both nostrils daily. 16 g 6   levocetirizine (XYZAL ALLERGY 24HR) 5 MG tablet Take 1 tablet (5 mg total) by mouth every evening. 30 tablet 0   Omega-3 Fatty Acids (OMEGA-3 FISH OIL PO) Take by mouth.     traZODone (DESYREL) 50 MG tablet Take 1 tablet (50 mg total) by mouth at bedtime. 30 tablet 0   No current facility-administered medications on file prior to visit.    Observations/Objective: Alert and oriented x 3. Not in acute distress. Physical examination not completed as this is a telemedicine visit.  Assessment and Plan: 1. Insomnia, unspecified type: - Resume Trazodone as prescribed once upper respiratory tract infection resolves, patient verbalized understanding.  - Follow-up with primary provider 4 months or sooner if needed.  - traZODone (DESYREL) 50 MG tablet; Take 1 tablet (50 mg total) by mouth at bedtime.  Dispense: 60 tablet; Refill: 1   Follow Up Instructions: Follow-up with primary provider in 4 months or sooner if needed.    Patient was given clear instructions to go to Emergency Department or return to medical center if symptoms don't improve, worsen, or new problems develop.The patient verbalized understanding.  I discussed the assessment and treatment plan with the patient. The patient was provided an opportunity to ask questions and all were answered. The patient agreed with the plan and demonstrated an understanding of the instructions.   The patient was advised to call back or seek an in-person  evaluation if the symptoms worsen or if the condition fails to improve as anticipated.     I provided 6 minutes total of non-face-to-face time during this encounter.   Rema Fendt, NP  [facility] [place], [place] [phone] 05/17/2021, 10:50 AM

## 2021-05-15 ENCOUNTER — Encounter: Payer: Self-pay | Admitting: Nurse Practitioner

## 2021-05-17 ENCOUNTER — Ambulatory Visit (INDEPENDENT_AMBULATORY_CARE_PROVIDER_SITE_OTHER): Payer: BC Managed Care – PPO | Admitting: Family

## 2021-05-17 ENCOUNTER — Other Ambulatory Visit: Payer: Self-pay

## 2021-05-17 DIAGNOSIS — G4700 Insomnia, unspecified: Secondary | ICD-10-CM

## 2021-05-17 MED ORDER — TRAZODONE HCL 50 MG PO TABS: 50.0000 mg | ORAL_TABLET | Freq: Every day | ORAL | 1 refills | Status: DC

## 2021-05-17 NOTE — Progress Notes (Signed)
Pt presents for telemedicine for insomnia, pt started Trazodone on 11/17 states its been working great

## 2021-05-20 ENCOUNTER — Ambulatory Visit: Payer: BC Managed Care – PPO | Admitting: Podiatry

## 2021-07-09 NOTE — Progress Notes (Unsigned)
61 y.o. [id number] Married White or Caucasian Not Hispanic or Latino female here for annual exam.      Patient's last menstrual period was 06/05/2011.          Sexually active: {yes no:314532}  The current method of family planning is post menopausal status.    Exercising: {yes no:314532}  {types:19826} Smoker:  yes  Health Maintenance: Pap: 04/30/20-WNL; 01/01/17-WNL, HPV- neg History of abnormal Pap:  yes, cryo in the past MMG:  09/26/19-neg birads 1 BMD:   *** Colonoscopy: 2018/2019-polyps, f/u in 71yrs TDaP: 03/09/13 Gardasil: n/a   reports that she has been smoking cigarettes. She has never used smokeless tobacco. She reports current alcohol use. She reports that she does not use drugs.  Past Medical History:  Diagnosis Date   Abnormal Pap smear 1993   after childbirth with cryo, normal since   Open fracture of leg age 51   bar went through right leg    Past Surgical History:  Procedure Laterality Date   BREAST BIOPSY Left 07/26/13   fibrocystic changes   TONSILLECTOMY AND ADENOIDECTOMY  age 24   TUBAL LIGATION  1993    Current Outpatient Medications  Medication Sig Dispense Refill   atorvastatin (LIPITOR) 20 MG tablet Take 1 tablet (20 mg total) by mouth daily. 90 tablet 0   Cholecalciferol (VITAMIN D3 PO) Take by mouth.     fluticasone (FLONASE) 50 MCG/ACT nasal spray Place 2 sprays into both nostrils daily. 16 g 6   levocetirizine (XYZAL ALLERGY 24HR) 5 MG tablet Take 1 tablet (5 mg total) by mouth every evening. 30 tablet 0   Omega-3 Fatty Acids (OMEGA-3 FISH OIL PO) Take by mouth.     traZODone (DESYREL) 50 MG tablet Take 1 tablet (50 mg total) by mouth at bedtime. 60 tablet 1   No current facility-administered medications for this visit.    Family History  Problem Relation Age of Onset   Breast cancer Mother 57       no treatment yet   Diabetes Mother    Breast cancer Maternal Grandmother        maybe age 6's    Review of Systems  Exam:   LMP 06/05/2011    Weight change: @WEIGHTCHANGE @ Height:      Ht Readings from Last 3 Encounters:  04/18/21 5\' 5"  (1.651 m)  03/06/21 5\' 5"  (1.651 m)  04/30/20 5' 4.25" (1.632 m)    General appearance: alert, cooperative and appears stated age Head: Normocephalic, without obvious abnormality, atraumatic Neck: no adenopathy, supple, symmetrical, trachea midline and thyroid {CHL AMB PHY EX THYROID NORM [phone]::"normal to inspection and palpation"} Lungs: clear to auscultation bilaterally Cardiovascular: regular rate and rhythm Breasts: {Exam; breast:13139::"normal appearance, no masses or tenderness"} Abdomen: soft, non-tender; non distended,  no masses,  no organomegaly Extremities: extremities normal, atraumatic, no cyanosis or edema Skin: Skin color, texture, turgor normal. No rashes or lesions Lymph nodes: Cervical, supraclavicular, and axillary nodes normal. No abnormal inguinal nodes palpated Neurologic: Grossly normal   Pelvic: External genitalia:  no lesions              Urethra:  normal appearing urethra with no masses, tenderness or lesions              Bartholins and Skenes: normal                 Vagina: normal appearing vagina with normal color and discharge, no lesions  Cervix: {CHL AMB PHY EX CERVIX NORM DEFAULT:[phone]::"no lesions"}               Bimanual Exam:  Uterus:  {CHL AMB PHY EX UTERUS NORM [phone]::"normal size, contour, position, consistency, mobility, non-tender"}              Adnexa: {CHL AMB PHY EX ADNEXA NO MASS DEFAULT:[phone]::"no mass, fullness, tenderness"}               Rectovaginal: Confirms               Anus:  normal sphincter tone, no lesions  *** chaperoned for the exam.  A:  Well Woman with normal exam  P:

## 2021-07-18 ENCOUNTER — Ambulatory Visit: Payer: BC Managed Care – PPO | Admitting: Obstetrics and Gynecology

## 2021-08-27 ENCOUNTER — Other Ambulatory Visit: Payer: Self-pay | Admitting: Family

## 2021-08-27 DIAGNOSIS — E785 Hyperlipidemia, unspecified: Secondary | ICD-10-CM

## 2021-09-10 NOTE — Progress Notes (Signed)
? ? ?Patient ID: Jo Bell, female    DOB: 05-01-1961  MRN: [id number] ? ?CC: Hyperlipidemia Follow-Up ? ?Subjective: ?Jo Bell is a 61 y.o. female who presents for hyperlipidemia follow-up.  ? ?Her concerns today include: ?Doing well on current cholesterol medication, no issues/concerns. Reports feeling fatigued recently despite getting adequate rest and hydration.  ? ? ?  09/11/2021  ?  3:06 PM 05/17/2021  ? 10:44 AM 04/18/2021  ?  8:14 AM 03/06/2021  ?  3:08 PM  ?Depression screen PHQ 2/9  ?Decreased Interest 0 0 0 0  ?Down, Depressed, Hopeless 0 0 0 0  ?PHQ - 2 Score 0 0 0 0  ? ? ?Patient Active Problem List  ? Diagnosis Date Noted  ? Hyperlipidemia 04/19/2021  ? Insomnia 04/18/2021  ?  ? ?Current Outpatient Medications on File Prior to Visit  ?Medication Sig Dispense Refill  ? atorvastatin (LIPITOR) 20 MG tablet Take 1 tablet by mouth once daily 30 tablet 0  ? Cholecalciferol (VITAMIN D3 PO) Take by mouth.    ? fluticasone (FLONASE) 50 MCG/ACT nasal spray Place 2 sprays into both nostrils daily. 16 g 6  ? levocetirizine (XYZAL ALLERGY 24HR) 5 MG tablet Take 1 tablet (5 mg total) by mouth every evening. 30 tablet 0  ? Omega-3 Fatty Acids (OMEGA-3 FISH OIL PO) Take by mouth.    ? traZODone (DESYREL) 50 MG tablet Take 1 tablet (50 mg total) by mouth at bedtime. 60 tablet 1  ? ?No current facility-administered medications on file prior to visit.  ? ? ?Allergies  ?Allergen Reactions  ? Novocain [Procaine]   ?  swelling  ? Penicillins Other (See Comments)  ? ? ?Social History  ? ?Socioeconomic History  ? Marital status: Married  ?  Spouse name: Not on file  ? Number of children: Not on file  ? Years of education: Not on file  ? Highest education level: Not on file  ?Occupational History  ? Not on file  ?Tobacco Use  ? Smoking status: Every Day  ?  Years: 20.00  ?  Types: Cigarettes  ? Smokeless tobacco: Never  ?Substance and Sexual Activity  ? Alcohol use: Yes  ?  Alcohol/week: 0.0 - 5.0 standard drinks  ? Drug  use: No  ? Sexual activity: Yes  ?  Partners: Male  ?  Birth control/protection: Post-menopausal  ?Other Topics Concern  ? Not on file  ?Social History Narrative  ? Not on file  ? ?Social Determinants of Health  ? ?Financial Resource Strain: Not on file  ?Food Insecurity: Not on file  ?Transportation Needs: Not on file  ?Physical Activity: Not on file  ?Stress: Not on file  ?Social Connections: Not on file  ?Intimate Partner Violence: Not on file  ? ? ?Family History  ?Problem Relation Age of Onset  ? Breast cancer Mother 36  ?     no treatment yet  ? Diabetes Mother   ? Breast cancer Maternal Grandmother   ?     maybe age 75's  ? ? ?Past Surgical History:  ?Procedure Laterality Date  ? BREAST BIOPSY Left 07/26/13  ? fibrocystic changes  ? TONSILLECTOMY AND ADENOIDECTOMY  age 64  ? TUBAL LIGATION  1993  ? ? ?ROS: ?Review of Systems ?Negative except as stated above ? ?PHYSICAL EXAM: ?BP 123/76 (BP Location: Left Arm, Patient Position: Sitting, Cuff Size: Large)   Pulse 70   Temp 98.3 ?F (36.8 ?C)   Resp 16   Ht  _0  (1.651 m)   Wt 155 lb (70.3 kg)   LMP 06/05/2011   SpO2 96%   BMI 25.79 kg/m?  ? ?Physical Exam ?HENT:  ?   Head: Normocephalic and atraumatic.  ?Eyes:  ?   Extraocular Movements: Extraocular movements intact.  ?   Conjunctiva/sclera: Conjunctivae normal.  ?   Pupils: Pupils are equal, round, and reactive to light.  ?Cardiovascular:  ?   Rate and Rhythm: Normal rate and regular rhythm.  ?   Pulses: Normal pulses.  ?   Heart sounds: Normal heart sounds.  ?Pulmonary:  ?   Effort: Pulmonary effort is normal.  ?   Breath sounds: Normal breath sounds.  ?Musculoskeletal:  ?   Cervical back: Normal range of motion and neck supple.  ?Neurological:  ?   General: No focal deficit present.  ?   Mental Status: She is alert and oriented to person, place, and time.  ?Psychiatric:     ?   Mood and Affect: Mood normal.     ?   Behavior: Behavior normal.  ? ?ASSESSMENT AND PLAN: ?1. Hyperlipidemia, unspecified  hyperlipidemia type: ?- Update lipid panel. Will update plan of care once lab results.  ?- Lipid Panel ? ?2. Fatigue, unspecified type: ?- Monitoring labs to screen for possible causes of fatigue.  ?- CBC ?- TSH ?- Hemoglobin A1c ?- CMP14+EGFR ?- Vitamin D, 25-hydroxy ? ? ? ?Patient was given the opportunity to ask questions.  Patient verbalized understanding of the plan and was able to repeat key elements of the plan. Patient was given clear instructions to go to Emergency Department or return to medical center if symptoms don't improve, worsen, or new problems develop.The patient verbalized understanding. ? ? ?Orders Placed This Encounter  ?Procedures  ? Lipid Panel  ? CBC  ? TSH  ? Hemoglobin A1c  ? CMP14+EGFR  ? Vitamin D, 25-hydroxy  ? ? ?Follow-up with primary provider as scheduled. ? ?Camillia Herter, NP  ?

## 2021-09-11 ENCOUNTER — Ambulatory Visit (INDEPENDENT_AMBULATORY_CARE_PROVIDER_SITE_OTHER): Payer: BC Managed Care – PPO | Admitting: Family

## 2021-09-11 ENCOUNTER — Encounter: Payer: Self-pay | Admitting: Family

## 2021-09-11 VITALS — BP 123/76 | HR 70 | Temp 98.3°F | Resp 16 | Ht 65.0 in | Wt 155.0 lb

## 2021-09-11 DIAGNOSIS — E785 Hyperlipidemia, unspecified: Secondary | ICD-10-CM | POA: Diagnosis not present

## 2021-09-11 DIAGNOSIS — R5383 Other fatigue: Secondary | ICD-10-CM | POA: Diagnosis not present

## 2021-09-11 NOTE — Progress Notes (Signed)
Pt presents for hyperlipidemia follow-up ?Pt complains of extreme fatigue even after a full night rest, pt states she feels exhausted  ?

## 2021-09-12 ENCOUNTER — Encounter: Payer: Self-pay | Admitting: Family

## 2021-09-12 ENCOUNTER — Other Ambulatory Visit: Payer: Self-pay

## 2021-09-12 DIAGNOSIS — E785 Hyperlipidemia, unspecified: Secondary | ICD-10-CM

## 2021-09-12 DIAGNOSIS — R7303 Prediabetes: Secondary | ICD-10-CM | POA: Insufficient documentation

## 2021-09-12 LAB — CBC
Hematocrit: 39.6 % (ref 34.0–46.6)
Hemoglobin: 13.8 g/dL (ref 11.1–15.9)
MCH: 32.7 pg (ref 26.6–33.0)
MCHC: 34.8 g/dL (ref 31.5–35.7)
MCV: 94 fL (ref 79–97)
Platelets: 209 10*3/uL (ref 150–450)
RBC: 4.22 x10E6/uL (ref 3.77–5.28)
RDW: 12.6 % (ref 11.7–15.4)
WBC: 7.5 10*3/uL (ref 3.4–10.8)

## 2021-09-12 LAB — TSH: TSH: 3.41 u[IU]/mL (ref 0.450–4.500)

## 2021-09-12 LAB — CMP14+EGFR
ALT: 15 IU/L (ref 0–32)
AST: 16 IU/L (ref 0–40)
Albumin/Globulin Ratio: 2.1 (ref 1.2–2.2)
Albumin: 4.1 g/dL (ref 3.8–4.9)
Alkaline Phosphatase: 77 IU/L (ref 44–121)
BUN/Creatinine Ratio: 10 (ref 9–23)
BUN: 9 mg/dL (ref 6–24)
Bilirubin Total: 0.3 mg/dL (ref 0.0–1.2)
CO2: 23 mmol/L (ref 20–29)
Calcium: 9.1 mg/dL (ref 8.7–10.2)
Chloride: 107 mmol/L — ABNORMAL HIGH (ref 96–106)
Creatinine, Ser: 0.87 mg/dL (ref 0.57–1.00)
Globulin, Total: 2 g/dL (ref 1.5–4.5)
Glucose: 92 mg/dL (ref 70–99)
Potassium: 4.3 mmol/L (ref 3.5–5.2)
Sodium: 143 mmol/L (ref 134–144)
Total Protein: 6.1 g/dL (ref 6.0–8.5)
eGFR: 77 mL/min/{1.73_m2} (ref >59)

## 2021-09-12 LAB — LIPID PANEL
Chol/HDL Ratio: 3.9 ratio (ref 0.0–4.4)
Cholesterol, Total: 220 mg/dL — ABNORMAL HIGH (ref 100–199)
HDL: 56 mg/dL (ref >39)
LDL Chol Calc (NIH): 128 mg/dL — ABNORMAL HIGH (ref 0–99)
Triglycerides: 206 mg/dL — ABNORMAL HIGH (ref 0–149)
VLDL Cholesterol Cal: 36 mg/dL (ref 5–40)

## 2021-09-12 LAB — HEMOGLOBIN A1C
Est. average glucose Bld gHb Est-mCnc: 117 mg/dL
Hgb A1c MFr Bld: 5.7 % — ABNORMAL HIGH (ref 4.8–5.6)

## 2021-09-12 LAB — VITAMIN D 25 HYDROXY (VIT D DEFICIENCY, FRACTURES): Vit D, 25-Hydroxy: 82 ng/mL (ref 30.0–100.0)

## 2021-09-12 MED ORDER — ATORVASTATIN CALCIUM 20 MG PO TABS: 20.0000 mg | ORAL_TABLET | Freq: Every day | ORAL | 0 refills | Status: DC

## 2021-09-12 NOTE — Progress Notes (Signed)
Kidney function normal.  ? ?Liver function normal.  ? ?Thyroid function normal.  ? ?Vitamin D normal.  ? ?No anemia.  ? ?Continue Atorvastatin for cholesterol maintenance.  ? ?Hemoglobin A1c is consistent with pre-diabetes. Practice healthy eating habits of fresh fruit and vegetables, lean baked meats such as chicken, fish, and [place]; limit breads, rice, pastas, and desserts; practice regular aerobic exercise (at least 150 minutes a week as tolerated). No medication needed as of present. Recheck in 6 months.  ?

## 2021-09-24 NOTE — Progress Notes (Signed)
61 y.o. [id number] Married White or Caucasian Not Hispanic or Latino female here for annual exam.   ?No vaginal bleeding. ?Not sexually active, too tired. Recent blood work was only significant for prediabetes. She has changed her diet in the last few weeks.  ?  ? ?Patient's last menstrual period was 06/05/2011.          ?Sexually active: No.  ?The current method of family planning is none.    ?Exercising: Yes.    Home exercise routine includes walking 1 hrs per day. ?Smoker:  yes, cutting back, down to 6-7 cigarettes a day. Down from 1.5 PPD.  ? ?Health Maintenance: ?Pap:  04/30/20 WNL ,  01-01-17 neg HPV HR neg ?History of abnormal Pap:  Yes, cryo in the past ?MMG:  09/26/19 Bi-rads 1 neg  ?BMD:   none  ?Colonoscopy:  2018 or 2019 polyps f/u 38yrs ?TDaP:  2014  ?Gardasil: n/a ? ? reports that she has been smoking cigarettes. She has never used smokeless tobacco. She reports current alcohol use. She reports that she does not use drugs. She works at a desk job. She 3 children and 6 grandchildren (range in age from 2-17). 3 oldest grandchildren are local, others are in [place].  ? ?Past Medical History:  ?Diagnosis Date  ? Abnormal Pap smear 1993  ? after childbirth with cryo, normal since  ? Open fracture of leg age 86  ? bar went through right leg  ? ? ?Past Surgical History:  ?Procedure Laterality Date  ? BREAST BIOPSY Left 07/26/13  ? fibrocystic changes  ? TONSILLECTOMY AND ADENOIDECTOMY  age 71  ? TUBAL LIGATION  1993  ? ? ?Current Outpatient Medications  ?Medication Sig Dispense Refill  ? Ascorbic Acid (VITAMIN C PO) Take by mouth.    ? atorvastatin (LIPITOR) 20 MG tablet Take 1 tablet (20 mg total) by mouth daily. 90 tablet 0  ? Cholecalciferol (VITAMIN D3 PO) Take by mouth.    ? fluticasone (FLONASE) 50 MCG/ACT nasal spray Place 2 sprays into both nostrils daily. 16 g 6  ? Multiple Vitamin (MULTIVITAMIN) capsule Take 1 capsule by mouth daily.    ? Omega-3 Fatty Acids (OMEGA-3 FISH OIL PO) Take by mouth.    ?  traZODone (DESYREL) 50 MG tablet Take 1 tablet (50 mg total) by mouth at bedtime. 60 tablet 1  ? ?No current facility-administered medications for this visit.  ? ? ?Family History  ?Problem Relation Age of Onset  ? Breast cancer Mother 68  ?     no treatment yet  ? Diabetes Mother   ? Breast cancer Maternal Grandmother   ?     maybe age 83's  ? ? ?Review of Systems ? ?Exam:   ?BP (!) 142/80 (BP Location: Right Arm, Patient Position: Sitting, Cuff Size: Normal)   Pulse 68   Resp 16   Ht 5\' 5"  (1.651 m)   Wt 159 lb (72.1 kg)   LMP 06/05/2011   BMI 26.46 kg/m?   Weight change: @WEIGHTCHANGE @ Height:   Height: 5\' 5"  (165.1 cm)  ?Ht Readings from Last 3 Encounters:  ?09/30/21 5\' 5"  (1.651 m)  ?09/11/21 5\' 5"  (1.651 m)  ?04/18/21 5\' 5"  (1.651 m)  ? ? ?General appearance: alert, cooperative and appears stated age ?Head: Normocephalic, without obvious abnormality, atraumatic ?Neck: no adenopathy, supple, symmetrical, trachea midline and thyroid normal to inspection and palpation ?Lungs: clear to auscultation bilaterally ?Cardiovascular: regular rate and rhythm ?Breasts: normal appearance, no masses or tenderness ?Abdomen:  soft, non-tender; non distended,  no masses,  no organomegaly ?Extremities: extremities normal, atraumatic, no cyanosis or edema ?Skin: Skin color, texture, turgor normal. No rashes or lesions ?Lymph nodes: Cervical, supraclavicular, and axillary nodes normal. ?No abnormal inguinal nodes palpated ?Neurologic: Grossly normal ? ? ?Pelvic: External genitalia:  no lesions ?             Urethra:  normal appearing urethra with no masses, tenderness or lesions ?             Bartholins and Skenes: normal    ?             Vagina: normal appearing vagina with normal color and discharge, no lesions ?             Cervix: no lesions ?              ?Bimanual Exam:  Uterus:  normal size, contour, position, consistency, mobility, non-tender ?             Adnexa: no mass, fullness, tenderness ?               Rectovaginal: Confirms ?              Anus:  normal sphincter tone, no lesions ? ?Caryn Bee, CMA chaperoned for the exam. ? ?    ?1. Well woman exam ?Pap next year ?Mammogram overdue, she will schedule ?Colonoscopy UTD ?Labs with primary ?Discussed breast self exam ?Discussed calcium and vit D intake ?Plan DEXA at 97 ? ? ? ? ?

## 2021-09-30 ENCOUNTER — Ambulatory Visit (INDEPENDENT_AMBULATORY_CARE_PROVIDER_SITE_OTHER): Payer: BC Managed Care – PPO | Admitting: Obstetrics and Gynecology

## 2021-09-30 ENCOUNTER — Encounter: Payer: Self-pay | Admitting: Obstetrics and Gynecology

## 2021-09-30 VITALS — BP 142/80 | HR 68 | Resp 16 | Ht 65.0 in | Wt 159.0 lb

## 2021-09-30 DIAGNOSIS — Z01419 Encounter for gynecological examination (general) (routine) without abnormal findings: Secondary | ICD-10-CM

## 2021-09-30 NOTE — Patient Instructions (Signed)

## 2021-10-07 ENCOUNTER — Ambulatory Visit: Payer: Self-pay

## 2021-10-07 NOTE — Telephone Encounter (Signed)
Second attempt made to call pt. "Call cannot be completed as dialed." ?

## 2021-10-07 NOTE — Telephone Encounter (Signed)
Summary: leg pain  ? Pt has had pain at the top of her leg towards the groin area since last Wed.  She doesn't know how she hurt it, but the pain gets bad when she walks on it.  Leg wants to "buckle under" when she walks..please advise.  No appt til Wed.   ?  ? ? ?Attempted to call pt several times and each time recording "your call cannot be completed as dialed." ?

## 2021-10-07 NOTE — Telephone Encounter (Addendum)
Third attempt made and was not able to contact pt. Routing to office ?Reason for Disposition ?? Third attempt to contact caller AND no contact made. Phone number verified. ? ?Protocols used: No Contact or Duplicate Contact Call-A-AH ? ?

## 2021-10-09 ENCOUNTER — Ambulatory Visit (INDEPENDENT_AMBULATORY_CARE_PROVIDER_SITE_OTHER): Payer: BC Managed Care – PPO | Admitting: Radiology

## 2021-10-09 VITALS — BP 136/70 | Temp 97.9°F

## 2021-10-09 DIAGNOSIS — R1032 Left lower quadrant pain: Secondary | ICD-10-CM | POA: Diagnosis not present

## 2021-10-09 DIAGNOSIS — R102 Pelvic and perineal pain: Secondary | ICD-10-CM | POA: Diagnosis not present

## 2021-10-09 MED ORDER — DICLOFENAC SODIUM 75 MG PO TBEC: 75.0000 mg | DELAYED_RELEASE_TABLET | Freq: Two times a day (BID) | ORAL | 0 refills | Status: DC

## 2021-10-09 NOTE — Progress Notes (Signed)
? ? ? ? ?  Jo Bell is 61 y.o. female presenting with complaint of pelvic pain x 6 days. Feels a pulling and burning sensation in the LLQ. No changes in bowels or bladder. Originally thought it was a pulled muscle but did not improve with NSAIDs, muscle relaxers or rest.  ? ? ?Subjective: ?Location of Pain: LLQ  ?Does pain radiate? no ?Prior h/o similar pain? no ? ?Review of Systems  ?Genitourinary:  Positive for pelvic pain.  ?All other systems reviewed and are negative.  ? ? ?Objective:  ?Physical Exam ?Vitals reviewed.  ?Constitutional:   ?   Appearance: Normal appearance. She is normal weight.  ?HENT:  ?   Head: Normocephalic and atraumatic.  ?Cardiovascular:  ?   Rate and Rhythm: Normal rate.  ?Pulmonary:  ?   Effort: Pulmonary effort is normal.  ?Abdominal:  ?   General: Abdomen is flat. Bowel sounds are normal.  ?   Palpations: Abdomen is soft.  ?Skin: ?   General: Skin is warm and dry.  ?Neurological:  ?   Mental Status: She is alert.  ?Psychiatric:     ?   Mood and Affect: Mood normal.     ?   Thought Content: Thought content normal.     ?   Judgment: Judgment normal.  ? Pelvic exam: VULVA: normal appearing vulva with no masses, tenderness or lesions, VAGINA: normal appearing vagina with normal color and discharge, no lesions, CERVIX: normal appearing cervix without discharge or lesions, UTERUS: uterus is normal size, shape, consistency and nontender, ADNEXA: tenderness left. ?Chaperone offered and declined for exam. ?Urine dipstick shows positive for ketones.  Micro exam: 3-10 RBC's per HPF.  ? ?Assessment/Plan: ?1. Pelvic pain ? ?- Urinalysis,Complete w/RFL Culture ?- diclofenac (VOLTAREN) 75 MG EC tablet; Take 1 tablet (75 mg total) by mouth 2 (two) times daily. As needed  Dispense: 30 tablet; Refill: 0 ? ?2. Acute left lower quadrant pain ? ?- US Transvaginal Non-OB; Future  ?  ?

## 2021-10-11 LAB — URINE CULTURE
MICRO NUMBER:: 13377301
SPECIMEN QUALITY:: ADEQUATE

## 2021-10-11 LAB — URINALYSIS, COMPLETE W/RFL CULTURE
Bacteria, UA: NONE SEEN /HPF
Bilirubin Urine: NEGATIVE
Casts: NONE SEEN /LPF
Crystals: NONE SEEN /HPF
Glucose, UA: NEGATIVE
Hgb urine dipstick: NEGATIVE
Hyaline Cast: NONE SEEN /LPF
Leukocyte Esterase: NEGATIVE
Nitrites, Initial: NEGATIVE
Protein, ur: NEGATIVE
Specific Gravity, Urine: 1.026 (ref 1.001–1.035)
WBC, UA: NONE SEEN /HPF (ref 0–5)
Yeast: NONE SEEN /HPF
pH: 5.5 (ref 5.0–8.0)

## 2021-10-11 LAB — CULTURE INDICATED

## 2021-10-16 ENCOUNTER — Encounter: Payer: Self-pay | Admitting: Family

## 2021-11-14 ENCOUNTER — Other Ambulatory Visit: Payer: BC Managed Care – PPO

## 2021-11-14 ENCOUNTER — Other Ambulatory Visit: Payer: BC Managed Care – PPO | Admitting: Obstetrics and Gynecology

## 2022-05-15 NOTE — Progress Notes (Signed)
Patient ID: Jo Bell, female    DOB: 1961-03-02  MRN: [id number]  CC: Knot on neck  Subjective: Jo Bell is a 61 y.o. female who presents for knot on neck.  Her concerns today include:  Noticed knot of left upper neck incidentally 3 months ago. Feels dime sized and has not significantly increased in size since began. Causes intermittent discomfort/dull ache at the site. Taking over-the-counter medications and helping. Denies red flag symptoms. No further issues/concerns.    Patient Active Problem List   Diagnosis Date Noted   Prediabetes 09/12/2021   Hyperlipidemia 04/19/2021   Insomnia 04/18/2021     Current Outpatient Medications on File Prior to Visit  Medication Sig Dispense Refill   Ascorbic Acid (VITAMIN C PO) Take by mouth.     atorvastatin (LIPITOR) 20 MG tablet Take 1 tablet (20 mg total) by mouth daily. 90 tablet 0   Cholecalciferol (VITAMIN D3 PO) Take by mouth.     diclofenac (VOLTAREN) 75 MG EC tablet Take 1 tablet (75 mg total) by mouth 2 (two) times daily. As needed 30 tablet 0   fluticasone (FLONASE) 50 MCG/ACT nasal spray Place 2 sprays into both nostrils daily. (Patient not taking: Reported on 10/09/2021) 16 g 6   Multiple Vitamin (MULTIVITAMIN) capsule Take 1 capsule by mouth daily.     Omega-3 Fatty Acids (OMEGA-3 FISH OIL PO) Take by mouth.     traZODone (DESYREL) 50 MG tablet Take 1 tablet (50 mg total) by mouth at bedtime. 60 tablet 1   No current facility-administered medications on file prior to visit.    Allergies  Allergen Reactions   Novocain [Procaine]     swelling   Penicillins Other (See Comments)    Social History   Socioeconomic History   Marital status: Married    Spouse name: Not on file   Number of children: Not on file   Years of education: Not on file   Highest education level: Not on file  Occupational History   Not on file  Tobacco Use   Smoking status: Every Day    Years: 20.00    Types: Cigarettes    Passive  exposure: Current   Smokeless tobacco: Never  Substance and Sexual Activity   Alcohol use: Yes    Alcohol/week: 0.0 - 5.0 standard drinks of alcohol   Drug use: No   Sexual activity: Not Currently    Partners: Male    Birth control/protection: Post-menopausal  Other Topics Concern   Not on file  Social History Narrative   Not on file   Social Determinants of Health   Financial Resource Strain: Not on file  Food Insecurity: Not on file  Transportation Needs: Not on file  Physical Activity: Not on file  Stress: Not on file  Social Connections: Not on file  Intimate Partner Violence: Not on file    Family History  Problem Relation Age of Onset   Breast cancer Mother 60       no treatment yet   Diabetes Mother    Breast cancer Maternal Grandmother        maybe age 8's    Past Surgical History:  Procedure Laterality Date   BREAST BIOPSY Left 07/26/13   fibrocystic changes   TONSILLECTOMY AND ADENOIDECTOMY  age 65   TUBAL LIGATION  1993    ROS: Review of Systems Negative except as stated above  PHYSICAL EXAM: BP 132/81 (BP Location: Left Arm, Patient Position: Sitting, Cuff Size: Normal)  Pulse 82   Temp 98.3 F (36.8 C)   Resp 16   Ht 5\' 5"  (1.651 m)   Wt 155 lb (70.3 kg)   LMP 06/05/2011   SpO2 98%   BMI 25.79 kg/m   Physical Exam HENT:     Head: Normocephalic and atraumatic.  Eyes:     Extraocular Movements: Extraocular movements intact.     Conjunctiva/sclera: Conjunctivae normal.     Pupils: Pupils are equal, round, and reactive to light.  Neck:     Comments: Firm nodule left upper neck. No additional symptoms. Cardiovascular:     Rate and Rhythm: Normal rate and regular rhythm.     Pulses: Normal pulses.     Heart sounds: Normal heart sounds.  Pulmonary:     Effort: Pulmonary effort is normal.     Breath sounds: Normal breath sounds.  Musculoskeletal:     Cervical back: Normal range of motion and neck supple.  Neurological:     General:  No focal deficit present.     Mental Status: She is alert and oriented to person, place, and time.  Psychiatric:        Mood and Affect: Mood normal.        Behavior: Behavior normal.      ASSESSMENT AND PLAN: 1. Nodule of neck - Patient denies red flag symptoms.  - Ultrasound soft tissue head/neck for further evaluation. - 08/03/2011 Soft Tissue Head/Neck (NON-THYROID); Future     Patient was given the opportunity to ask questions.  Patient verbalized understanding of the plan and was able to repeat key elements of the plan. Patient was given clear instructions to go to Emergency Department or return to medical center if symptoms don't improve, worsen, or new problems develop.The patient verbalized understanding.   Orders Placed This Encounter  Procedures   US Soft Tissue Head/Neck (NON-THYROID)    Follow-up with primary provider as scheduled.  [place], NP

## 2022-05-16 ENCOUNTER — Encounter: Payer: Self-pay | Admitting: Family

## 2022-05-16 ENCOUNTER — Ambulatory Visit (INDEPENDENT_AMBULATORY_CARE_PROVIDER_SITE_OTHER): Payer: BC Managed Care – PPO | Admitting: Family

## 2022-05-16 ENCOUNTER — Telehealth: Payer: Self-pay | Admitting: *Deleted

## 2022-05-16 VITALS — BP 132/81 | HR 82 | Temp 98.3°F | Resp 16 | Ht 65.0 in | Wt 155.0 lb

## 2022-05-16 DIAGNOSIS — R221 Localized swelling, mass and lump, neck: Secondary | ICD-10-CM

## 2022-05-16 NOTE — Progress Notes (Signed)
Pt presents for lump on back of neck left side -symptoms include headache  -going on for about 3 months

## 2022-05-16 NOTE — Telephone Encounter (Signed)
Copied from CRM [id number]. Topic: General - Other >> May 16, 2022  2:32 PM Everette C wrote: Reason for CRM: Arline Asp with [facility] has called to verify soft tissue ultrasound orders  Arline Asp would like to know if the patient's thyroid should be included   Please contact Arline Asp further when possible

## 2022-05-29 ENCOUNTER — Ambulatory Visit
Admission: RE | Admit: 2022-05-29 | Discharge: 2022-05-29 | Disposition: A | Discharge status: Home / Self Care | Payer: BC Managed Care – PPO | Source: Ambulatory Visit | Attending: Family | Admitting: Family

## 2022-05-29 DIAGNOSIS — R221 Localized swelling, mass and lump, neck: Secondary | ICD-10-CM

## 2022-06-17 IMAGING — DX DG RIBS W/ CHEST 3+V*L*
3 series · 3 of 3 positions shown · non-contrast
Comparison: None.

CLINICAL DATA: Fall, left-sided rib pain

EXAM:
LEFT RIBS AND CHEST - 3+ VIEW

[chest pa]
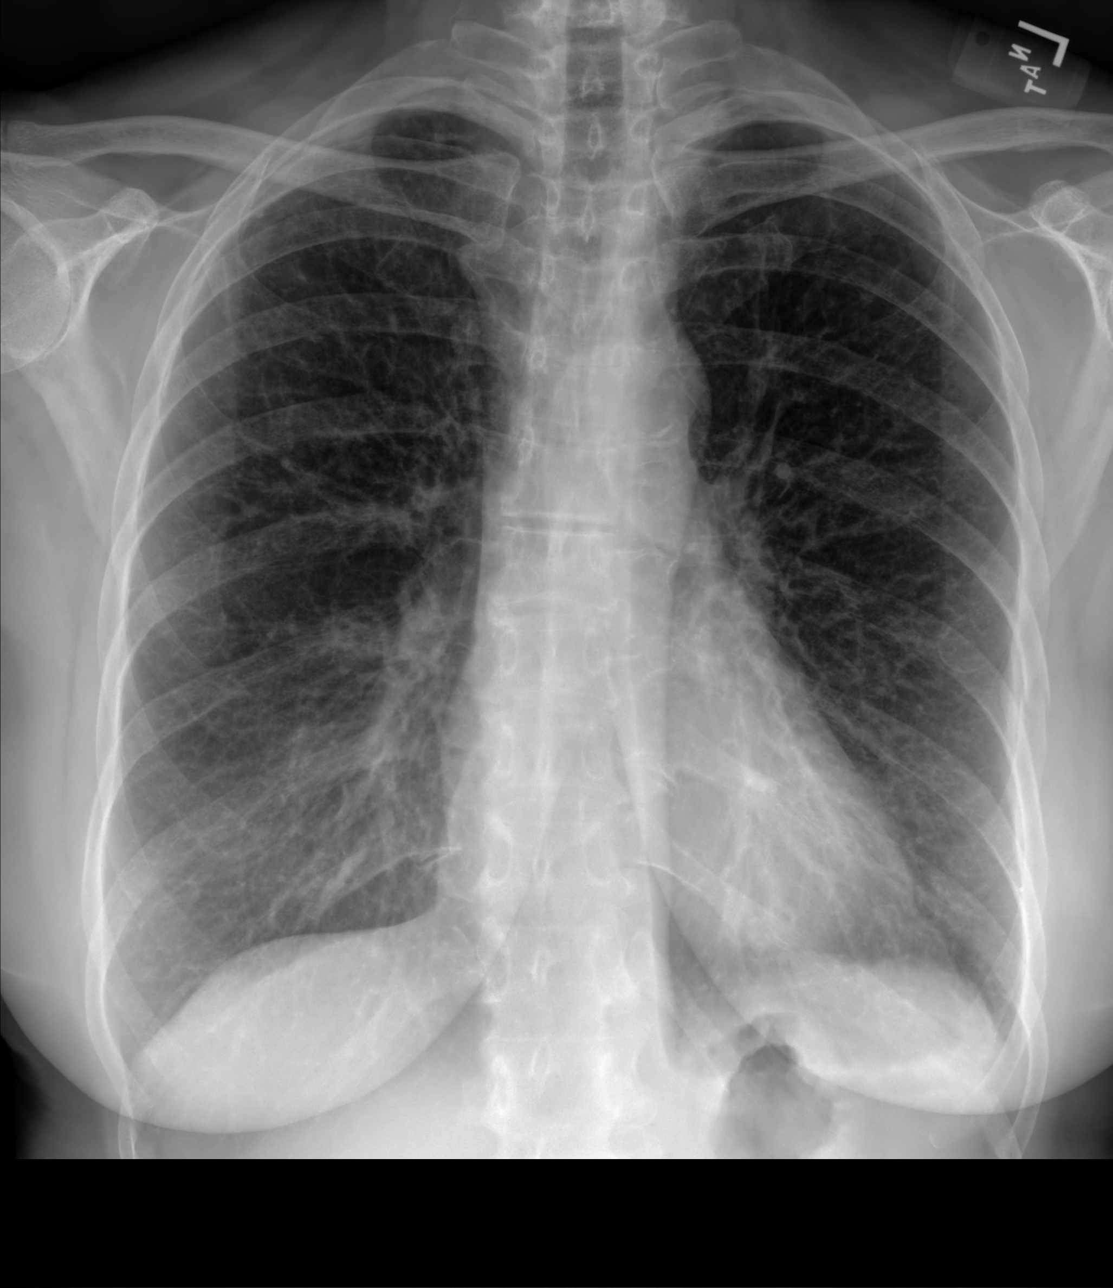

[hemithorax (ribs) pa]
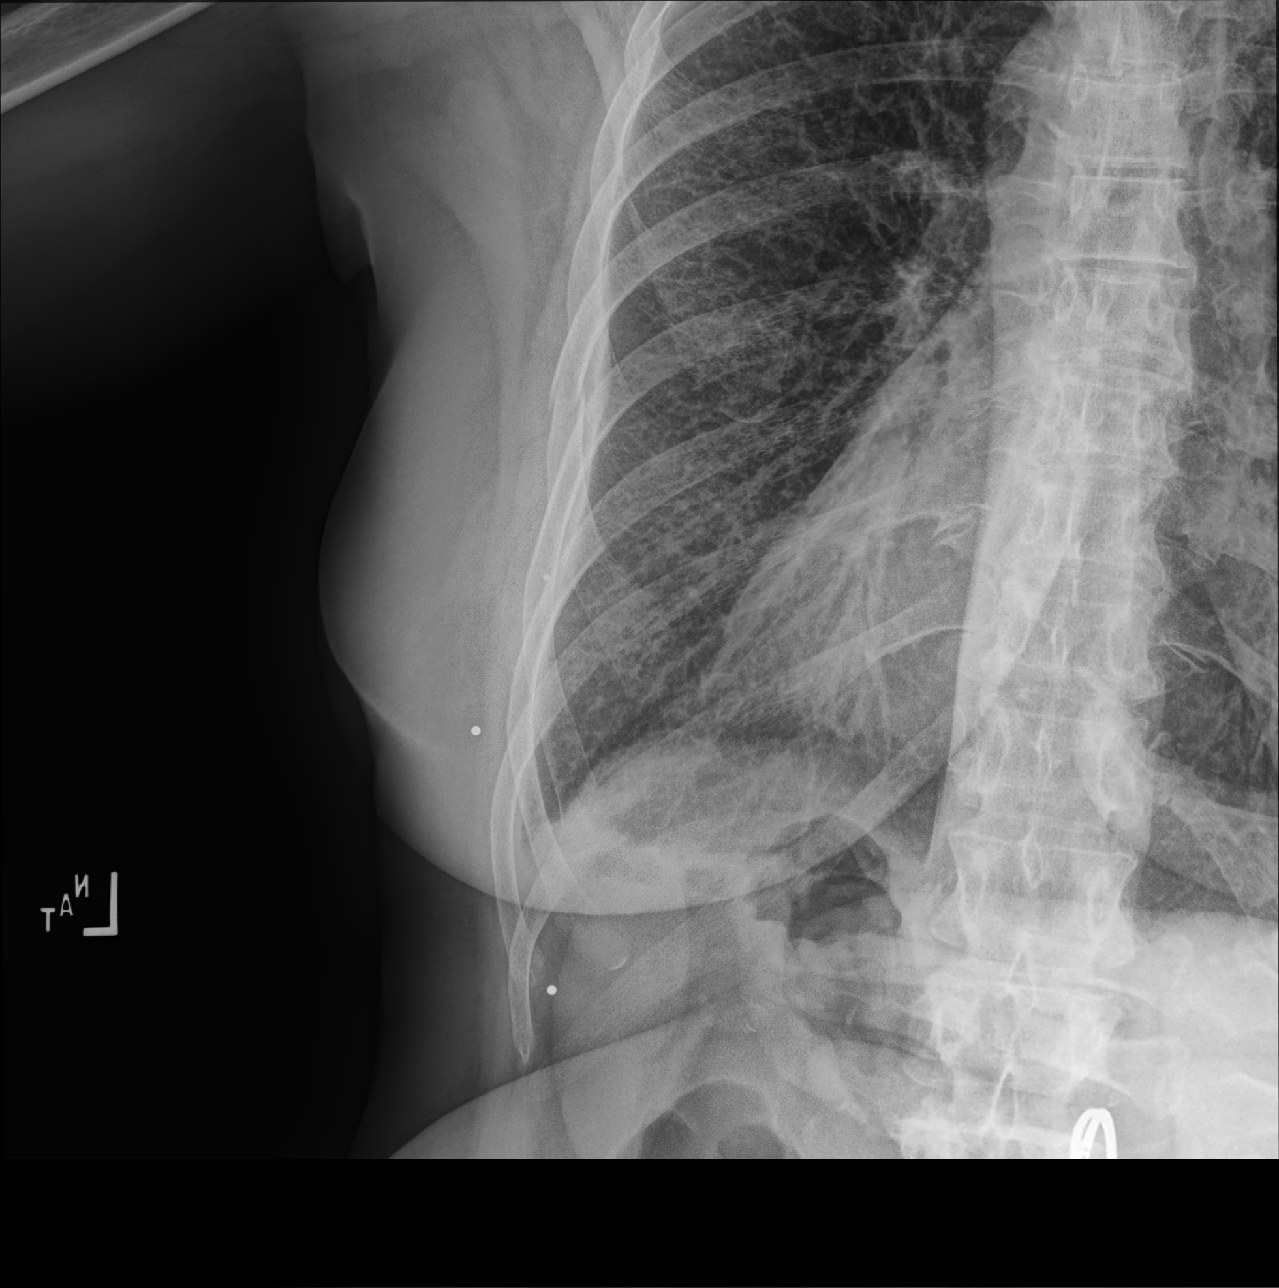

[hemithorax (ribs) ap]
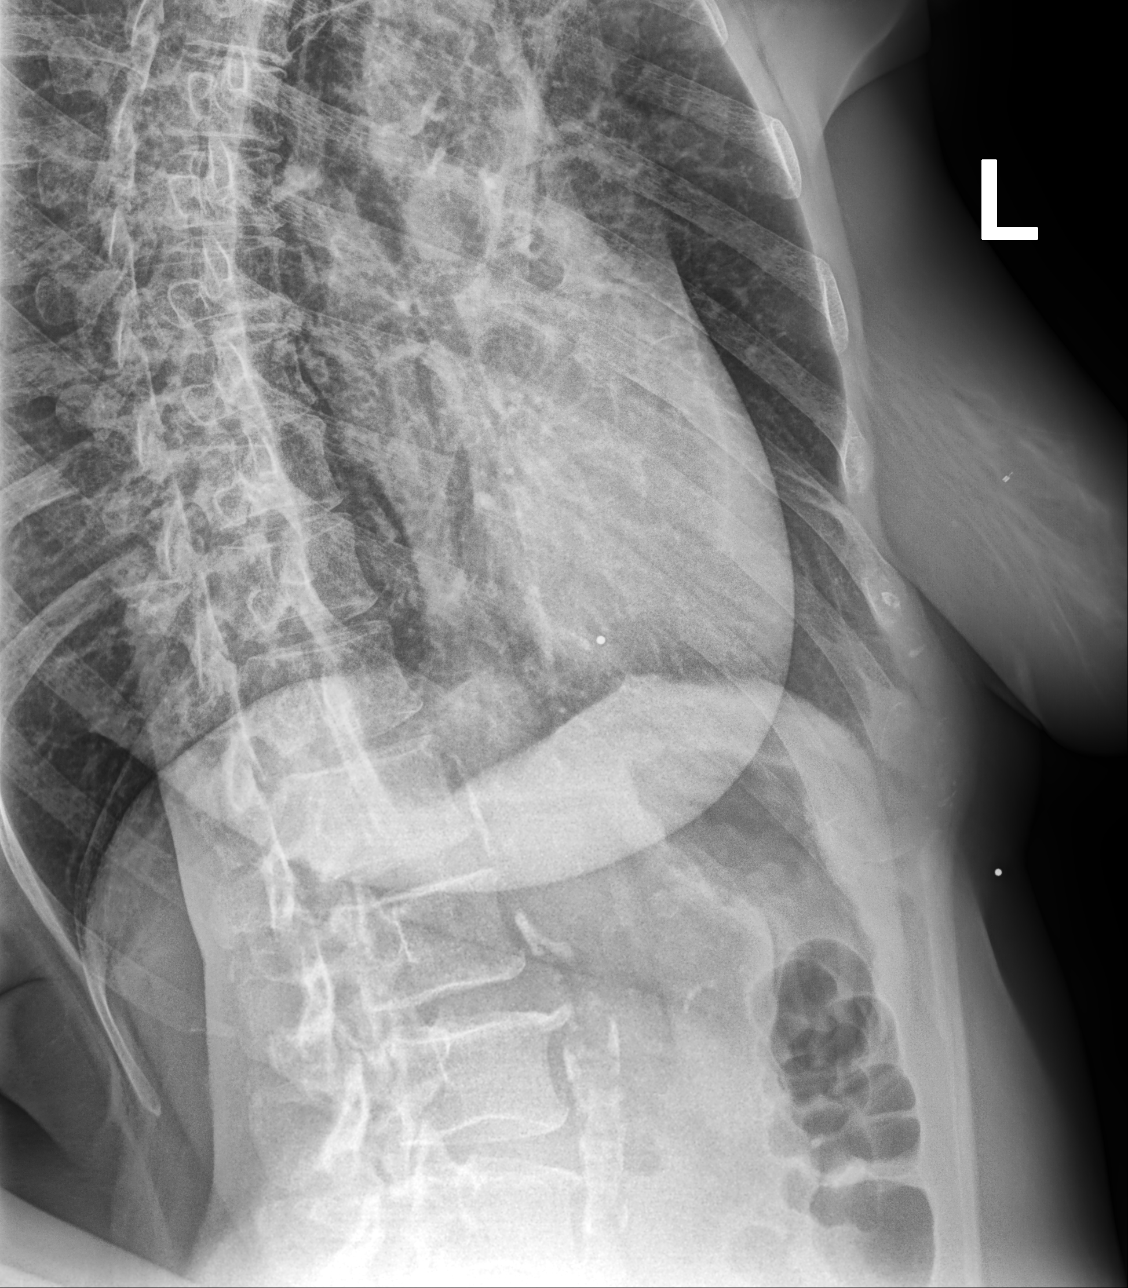

[3 of 3 positions shown; findings below may reference images not displayed]

FINDINGS: Minimally displaced fracture of the lateral left sixth rib. There is
no evidence of pneumothorax or pleural effusion. Both lungs are
clear. Heart size and mediastinal contours are within normal limits.
IMPRESSION: Minimally displaced fracture of the lateral left sixth rib. No
pneumothorax or pleural effusion. No acute abnormality of the lungs.

## 2022-08-08 DIAGNOSIS — F4312 Post-traumatic stress disorder, chronic: Secondary | ICD-10-CM | POA: Diagnosis not present

## 2022-11-17 NOTE — Progress Notes (Deleted)
62 y.o. [id number] Married White or Caucasian Not Hispanic or Latino female here for annual exam.      Patient's last menstrual period was 06/05/2011.          Sexually active: {yes no:314532}  The current method of family planning is {contraception:315051}.    Exercising: {yes no:314532}  {types:19826} Smoker:  {YES NO:22349}  Health Maintenance: Pap: 04/30/20 WNL ,  01-01-17 neg HPV HR neg History of abnormal Pap:  Yes, cryo in the past MMG:  09/26/19 Bi-rads 1 neg  BMD:   none  Colonoscopy:  2018 or 2019 polyps f/u 70yrs TDaP:  2014  Gardasil: n/a   reports that she has been smoking cigarettes. She has been exposed to tobacco smoke. She has never used smokeless tobacco. She reports current alcohol use. She reports that she does not use drugs.  Past Medical History:  Diagnosis Date   Abnormal Pap smear 1993   after childbirth with cryo, normal since   Open fracture of leg age 39   bar went through right leg    Past Surgical History:  Procedure Laterality Date   BREAST BIOPSY Left 07/26/13   fibrocystic changes   TONSILLECTOMY AND ADENOIDECTOMY  age 22   TUBAL LIGATION  1993    Current Outpatient Medications  Medication Sig Dispense Refill   Ascorbic Acid (VITAMIN C PO) Take by mouth.     atorvastatin (LIPITOR) 20 MG tablet Take 1 tablet (20 mg total) by mouth daily. 90 tablet 0   Cholecalciferol (VITAMIN D3 PO) Take by mouth.     diclofenac (VOLTAREN) 75 MG EC tablet Take 1 tablet (75 mg total) by mouth 2 (two) times daily. As needed 30 tablet 0   fluticasone (FLONASE) 50 MCG/ACT nasal spray Place 2 sprays into both nostrils daily. (Patient not taking: Reported on 10/09/2021) 16 g 6   Multiple Vitamin (MULTIVITAMIN) capsule Take 1 capsule by mouth daily.     Omega-3 Fatty Acids (OMEGA-3 FISH OIL PO) Take by mouth.     traZODone (DESYREL) 50 MG tablet Take 1 tablet (50 mg total) by mouth at bedtime. 60 tablet 1   No current facility-administered medications for this visit.     Family History  Problem Relation Age of Onset   Breast cancer Mother 44       no treatment yet   Diabetes Mother    Breast cancer Maternal Grandmother        maybe age 26's    Review of Systems  Exam:   LMP 06/05/2011   Weight change: @WEIGHTCHANGE @ Height:      Ht Readings from Last 3 Encounters:  05/16/22 5\' 5"  (1.651 m)  09/30/21 5\' 5"  (1.651 m)  09/11/21 5\' 5"  (1.651 m)    General appearance: alert, cooperative and appears stated age Head: Normocephalic, without obvious abnormality, atraumatic Neck: no adenopathy, supple, symmetrical, trachea midline and thyroid {CHL AMB PHY EX THYROID NORM DEFAULT:[phone]::"normal to inspection and palpation"} Lungs: clear to auscultation bilaterally Cardiovascular: regular rate and rhythm Breasts: {Exam; breast:13139::"normal appearance, no masses or tenderness"} Abdomen: soft, non-tender; non distended,  no masses,  no organomegaly Extremities: extremities normal, atraumatic, no cyanosis or edema Skin: Skin color, texture, turgor normal. No rashes or lesions Lymph nodes: Cervical, supraclavicular, and axillary nodes normal. No abnormal inguinal nodes palpated Neurologic: Grossly normal   Pelvic: External genitalia:  no lesions              Urethra:  normal appearing urethra with no masses, tenderness or  lesions              Bartholins and Skenes: normal                 Vagina: normal appearing vagina with normal color and discharge, no lesions              Cervix: {CHL AMB PHY EX CERVIX NORM DEFAULT:[phone]::"no lesions"}               Bimanual Exam:  Uterus:  {CHL AMB PHY EX UTERUS NORM [phone]::"normal size, contour, position, consistency, mobility, non-tender"}              Adnexa: {CHL AMB PHY EX ADNEXA NO MASS DEFAULT:[phone]::"no mass, fullness, tenderness"}               Rectovaginal: Confirms               Anus:  normal sphincter tone, no lesions  *** chaperoned for the exam.  A:  Well Woman with  normal exam  P:

## 2022-11-26 ENCOUNTER — Ambulatory Visit: Payer: BC Managed Care – PPO | Admitting: Obstetrics and Gynecology

## 2023-06-11 ENCOUNTER — Ambulatory Visit (INDEPENDENT_AMBULATORY_CARE_PROVIDER_SITE_OTHER): Payer: Managed Care, Other (non HMO) | Admitting: Radiology

## 2023-06-11 ENCOUNTER — Encounter: Payer: Self-pay | Admitting: Radiology

## 2023-06-11 ENCOUNTER — Other Ambulatory Visit (HOSPITAL_COMMUNITY)
Admission: RE | Admit: 2023-06-11 | Discharge: 2023-06-11 | Disposition: A | Discharge status: Home / Self Care | Payer: Managed Care, Other (non HMO) | Source: Ambulatory Visit | Attending: Radiology | Admitting: Radiology

## 2023-06-11 VITALS — BP 120/70 | HR 84 | Ht 64.0 in | Wt 158.0 lb

## 2023-06-11 DIAGNOSIS — Z01419 Encounter for gynecological examination (general) (routine) without abnormal findings: Secondary | ICD-10-CM | POA: Diagnosis present

## 2023-06-11 DIAGNOSIS — F17200 Nicotine dependence, unspecified, uncomplicated: Secondary | ICD-10-CM | POA: Diagnosis not present

## 2023-06-11 DIAGNOSIS — R6882 Decreased libido: Secondary | ICD-10-CM | POA: Diagnosis not present

## 2023-06-11 NOTE — Progress Notes (Signed)
   Jo Bell [birth year] [id number]   History: Postmenopausal 63 y.o. presents for annual exam. C/o low libido, otherwise doing well. Has a PCP. Overdue for mammogram.   Gynecologic History Postmenopausal Last Pap: 2021. Results were: normal Last mammogram: 2021. Results were: normal Last colonoscopy: 2018   Obstetric History OB History  Gravida Para Term Preterm AB Living  5 3 3  2 3   SAB IAB Ectopic Multiple Live Births          # Outcome Date GA Lbr Len/2nd Weight Sex Type Anes PTL Lv  5 Term 61    F Vag-Spont     4 Term 83    M Vag-Spont     3 Term 70    M Vag-Spont     2 AB           1 AB              The following portions of the patient's history were reviewed and updated as appropriate: allergies, current medications, past family history, past medical history, past social history, past surgical history, and problem list.  Review of Systems Pertinent items noted in HPI and remainder of comprehensive ROS otherwise negative.  Past medical history, past surgical history, family history and social history were all reviewed and documented in the EPIC chart.  Exam:  Vitals:   06/11/23 1553  BP: 120/70  Pulse: 84  SpO2: 95%  Weight: 158 lb (71.7 kg)  Height: 5' 4 (1.626 m)   Body mass index is 27.12 kg/m.  General appearance:  Normal Thyroid :  Symmetrical, normal in size, without palpable masses or nodularity. Respiratory  Auscultation:  Clear without wheezing or rhonchi Cardiovascular  Auscultation:  Regular rate, without rubs, murmurs or gallops  Edema/varicosities:  Not grossly evident Abdominal  Soft,nontender, without masses, guarding or rebound.  Liver/spleen:  No organomegaly noted  Hernia:  None appreciated  Skin  Inspection:  Grossly normal Breasts: Examined lying and sitting.   Right: Without masses, retractions, nipple discharge or axillary adenopathy.   Left: Without masses, retractions, nipple discharge or axillary  adenopathy. Genitourinary   Inguinal/mons:  Normal without inguinal adenopathy  External genitalia:  Normal appearing vulva with no masses, tenderness, or lesions  BUS/Urethra/Skene's glands:  Normal  Vagina:  Normal appearing with normal color and discharge, no lesions. Atrophy: moderate   Cervix:  Normal appearing without discharge or lesions  Uterus:  Normal in size, shape and contour.  Midline and mobile, nontender  Adnexa/parametria:     Rt: Normal in size, without masses or tenderness.   Lt: Normal in size, without masses or tenderness.  Anus and perineum: Normal    Jo Bell, CMA present for exam  Assessment/Plan:   1. Well woman exam with routine gynecological exam (Primary) - Cytology - PAP( [facility]) - DEXA ordered - Schedule mammo - Labs with PCP  2. Low libido Declines testosterone level check today, will consider    Discussed SBE, colonoscopy and DEXA screening as directed. Recommend of exercise weekly, including weight bearing exercise. Encouraged the use of seatbelts and sunscreen.  Return in 1 year for annual or sooner prn.  Jo Bell B WHNP-BC, 4:06 PM 06/11/2023

## 2023-06-16 LAB — CYTOLOGY - PAP
Adequacy: ABSENT
Comment: NEGATIVE
Diagnosis: NEGATIVE
High risk HPV: NEGATIVE

## 2023-06-24 ENCOUNTER — Ambulatory Visit
Admission: EM | Admit: 2023-06-24 | Discharge: 2023-06-24 | Disposition: A | Discharge status: Home / Self Care | Payer: Managed Care, Other (non HMO) | Attending: Family Medicine | Admitting: Family Medicine

## 2023-06-24 DIAGNOSIS — M5412 Radiculopathy, cervical region: Secondary | ICD-10-CM | POA: Diagnosis not present

## 2023-06-24 MED ORDER — TIZANIDINE HCL 4 MG PO TABS: 4.0000 mg | ORAL_TABLET | Freq: Three times a day (TID) | ORAL | 0 refills | Status: AC | PRN

## 2023-06-24 MED ORDER — PREDNISONE 20 MG PO TABS: 20.0000 mg | ORAL_TABLET | Freq: Every day | ORAL | 0 refills | Status: AC

## 2023-06-24 NOTE — ED Provider Notes (Signed)
[facility]    CSN: [id number] Arrival date & time: 06/24/23  1355      History   Chief Complaint Chief Complaint  Patient presents with   Neck Pain    HPI Jo Bell is a 63 y.o. female.    Neck Pain Acute onset upon awakening x 5 days. Pain initially mild, now severe, pain most associated with right side of neck and radiates into the right shoulder. Denies injury. No history of recurrent neck pain. Has tried otc medication without relief of pain.  Past Medical History:  Diagnosis Date   Abnormal Pap smear 1993   after childbirth with cryo, normal since   Open fracture of leg age 57   bar went through right leg    Patient Active Problem List   Diagnosis Date Noted   Prediabetes 09/12/2021   Hyperlipidemia 04/19/2021   Insomnia 04/18/2021    Past Surgical History:  Procedure Laterality Date   BREAST BIOPSY Left 07/26/13   fibrocystic changes   TONSILLECTOMY AND ADENOIDECTOMY  age 63   TUBAL LIGATION  1993    OB History     Gravida  5   Para  3   Term  3   Preterm      AB  2   Living  3      SAB      IAB      Ectopic      Multiple      Live Births               Home Medications    Prior to Admission medications   Medication Sig Start Date End Date Taking? Authorizing Provider  Ascorbic Acid (VITAMIN C PO) Take by mouth.   Yes [provider]  Cholecalciferol (VITAMIN D3 PO) Take by mouth.   Yes [provider]  Multiple Vitamin (MULTIVITAMIN) capsule Take 1 capsule by mouth daily.   Yes [provider]  Omega-3 Fatty Acids (OMEGA-3 FISH OIL PO) Take by mouth.   Yes [provider]  predniSONE (DELTASONE) 20 MG tablet Take 1 tablet (20 mg total) by mouth daily with breakfast for 5 days. 06/24/23 06/29/23 Yes Bing Neighbors, NP  tiZANidine (ZANAFLEX) 4 MG tablet Take 1 tablet (4 mg total) by mouth every 8 (eight) hours as needed for muscle spasms. 06/24/23  Yes Bing Neighbors, NP     Family History Family History  Problem Relation Age of Onset   Breast cancer Mother 32       no treatment yet   Diabetes Mother    Breast cancer Maternal Grandmother        maybe age 69's    Social History Social History   Tobacco Use   Smoking status: Every Day    Types: Cigarettes    Passive exposure: Current   Smokeless tobacco: Never  Vaping Use   Vaping status: Never Used  Substance Use Topics   Alcohol use: Yes    Alcohol/week: 0.0 - 5.0 standard drinks of alcohol    Comment: occsaionally   Drug use: No     Allergies   Novocain [procaine] and Penicillins   Review of Systems Review of Systems  Musculoskeletal:  Positive for neck pain.     Physical Exam Triage Vital Signs ED Triage Vitals  Encounter Vitals Group     BP 06/24/23 1413 139/80     Systolic BP Percentile --      Diastolic BP Percentile --  Pulse Rate 06/24/23 1413 84     Resp 06/24/23 1413 20     Temp 06/24/23 1413 98.1 F (36.7 C)     Temp Source 06/24/23 1413 Oral     SpO2 06/24/23 1413 96 %     Weight 06/24/23 1411 158 lb 1.1 oz (71.7 kg)     Height 06/24/23 1411 5\' 4"  (1.626 m)     Head Circumference --      Peak Flow --      Pain Score 06/24/23 1407 9     Pain Loc --      Pain Education --      Exclude from Growth Chart --    No data found.  Updated Vital Signs BP 139/80 (BP Location: Left Arm)   Pulse 84   Temp 98.1 F (36.7 C) (Oral)   Resp 20   Ht 5\' 4"  (1.626 m)   Wt 158 lb 1.1 oz (71.7 kg)   LMP 06/05/2011   SpO2 96%   BMI 27.13 kg/m   Visual Acuity Right Eye Distance:   Left Eye Distance:   Bilateral Distance:    Right Eye Near:   Left Eye Near:    Bilateral Near:     Physical Exam Vitals and nursing note reviewed.  Constitutional:      Appearance: Normal appearance.  HENT:     Head: Normocephalic and atraumatic.     Right Ear: External ear normal.     Left Ear: External ear normal.  Eyes:     Extraocular Movements: Extraocular movements  intact.     Pupils: Pupils are equal, round, and reactive to light.  Cardiovascular:     Rate and Rhythm: Normal rate and regular rhythm.  Musculoskeletal:        General: Normal range of motion.     Cervical back: Neck supple. Torticollis present. No edema, signs of trauma, rigidity or crepitus. Pain with movement, spinous process tenderness and muscular tenderness present.  Skin:    General: Skin is warm and dry.     Capillary Refill: Capillary refill takes less than 2 seconds.  Neurological:     General: No focal deficit present.     Mental Status: She is alert and oriented to person, place, and time.      UC Treatments / Results  Labs (all labs ordered are listed, but only abnormal results are displayed) Labs Reviewed - No data to display  EKG   Radiology No results found.  Procedures Procedures (including critical care time)  Medications Ordered in UC Medications - No data to display  Initial Impression / Assessment and Plan / UC Course  I have reviewed the triage vital signs and the nursing notes.  Pertinent labs & imaging results that were available during my care of the patient were reviewed by me and considered in my medical decision making (see chart for details).    Cervical Radiculopathy, treatment with prednisone 20 mg daily x 5 days and tizanidine 4 mg for acute pain. Encouraged warm compresses to neck pain and increase ROM as tolerated. Return precautions given if symptoms do not resolve with following completion of treatment.  Final Clinical Impressions(s) / UC Diagnoses   Final diagnoses:  Cervical radiculopathy   Discharge Instructions   None    ED Prescriptions     Medication Sig Dispense Auth. Provider   tiZANidine (ZANAFLEX) 4 MG tablet Take 1 tablet (4 mg total) by mouth every 8 (eight) hours as needed for muscle  spasms. 20 tablet Bing Neighbors, NP   predniSONE (DELTASONE) 20 MG tablet Take 1 tablet (20 mg total) by mouth daily with  breakfast for 5 days. 5 tablet Bing Neighbors, NP      PDMP not reviewed this encounter.   Bing Neighbors, NP 06/27/23 1314

## 2023-06-24 NOTE — ED Triage Notes (Signed)
"  This pain started on Friday upon waking up, every day it has gotten worse and now the pain has moved down my neck and right shoulder". No injury known. No chest pain. No sob.

## 2023-07-01 ENCOUNTER — Ambulatory Visit (HOSPITAL_BASED_OUTPATIENT_CLINIC_OR_DEPARTMENT_OTHER)
Admission: RE | Admit: 2023-07-01 | Discharge: 2023-07-01 | Disposition: A | Discharge status: Home / Self Care | Payer: Managed Care, Other (non HMO) | Source: Ambulatory Visit | Attending: Radiology | Admitting: Radiology

## 2023-07-01 DIAGNOSIS — F17200 Nicotine dependence, unspecified, uncomplicated: Secondary | ICD-10-CM | POA: Diagnosis present

## 2023-07-02 ENCOUNTER — Encounter: Payer: Self-pay | Admitting: Nurse Practitioner

## 2024-02-10 ENCOUNTER — Encounter: Admitting: Family Medicine

## 2024-07-01 ENCOUNTER — Ambulatory Visit: Admitting: Radiology

## 2024-07-01 ENCOUNTER — Encounter: Payer: Self-pay | Admitting: Radiology

## 2024-07-01 VITALS — BP 132/80 | HR 76 | Ht 64.0 in | Wt 163.0 lb

## 2024-07-01 DIAGNOSIS — Z01419 Encounter for gynecological examination (general) (routine) without abnormal findings: Secondary | ICD-10-CM

## 2024-07-01 DIAGNOSIS — Z87891 Personal history of nicotine dependence: Secondary | ICD-10-CM

## 2024-07-01 DIAGNOSIS — Z1331 Encounter for screening for depression: Secondary | ICD-10-CM | POA: Diagnosis not present

## 2024-07-01 NOTE — Progress Notes (Signed)
" ° °  Jo Bell 07-12-1960 [id number]   History: Postmenopausal 64 y.o. presents for annual exam. Stopped smoking this past year. No new gyn concerns. Looking for a new PCP. Waiting for a call back to schedule mammogram.  Gynecologic History Postmenopausal Last Pap: 2025. Results were: normal Last mammogram: 2021. Results were: normal Last colonoscopy: 2018 DEXA: 2025 osteopenia   Obstetric History OB History  Gravida Para Term Preterm AB Living  5 3 3  2 3   SAB IAB Ectopic Multiple Live Births          # Outcome Date GA Lbr Len/2nd Weight Sex Type Anes PTL Lv  5 Term 52    F Vag-Spont     4 Term 61    M Vag-Spont     3 Term 5    M Vag-Spont     2 AB           1 AB              The following portions of the patient's history were reviewed and updated as appropriate: allergies, current medications, past family history, past medical history, past social history, past surgical history, and problem list.  Review of Systems Pertinent items noted in HPI and remainder of comprehensive ROS otherwise negative.  Past medical history, past surgical history, family history and social history were all reviewed and documented in the EPIC chart.  Exam:  Vitals:   07/01/24 1419  BP: 132/80  Pulse: 76  SpO2: 96%  Weight: 163 lb (73.9 kg)  Height: 5' 4 (1.626 m)   Body mass index is 27.98 kg/m.  General appearance:  Normal Thyroid :  Symmetrical, normal in size, without palpable masses or nodularity. Respiratory  Auscultation:  Clear without wheezing or rhonchi Cardiovascular  Auscultation:  Regular rate, without rubs, murmurs or gallops  Edema/varicosities:  Not grossly evident Abdominal  Soft,nontender, without masses, guarding or rebound.  Liver/spleen:  No organomegaly noted  Hernia:  None appreciated  Skin  Inspection:  Grossly normal Breasts: Examined lying and sitting.   Right: Without masses, retractions, nipple discharge or axillary  adenopathy.   Left: Without masses, retractions, nipple discharge or axillary adenopathy. Genitourinary   Inguinal/mons:  Normal without inguinal adenopathy  External genitalia:  Normal appearing vulva with no masses, tenderness, or lesions  BUS/Urethra/Skene's glands:  Normal  Vagina:  Normal appearing with normal color and discharge, no lesions. Atrophy: moderate   Cervix:  Normal appearing without discharge or lesions  Uterus:  Normal in size, shape and contour.  Midline and mobile, nontender  Adnexa/parametria:     Rt: Normal in size, without masses or tenderness.   Lt: Normal in size, without masses or tenderness.  Anus and perineum: Normal    Jo Bell, Jo Bell present for exam  Assessment/Plan:   1. Well woman exam with routine gynecological exam (Primary) Pap 2028  2. Quit smoking within past year  3. Depression screen negative      Return in 1 year for annual or sooner prn.  Jo Bell B WHNP-BC, 2:42 PM 07/01/2024 "
